# Patient Record
Sex: Male | Born: 1965 | Race: White | Hispanic: No | Marital: Married | Smoking: Never smoker
Health system: Southern US, Community
[De-identification: ages and names within clinical notes are randomized; demographics above are authoritative.]

## PROBLEM LIST (undated history)

## (undated) DIAGNOSIS — K219 Gastro-esophageal reflux disease without esophagitis: Secondary | ICD-10-CM

## (undated) DIAGNOSIS — J349 Unspecified disorder of nose and nasal sinuses: Secondary | ICD-10-CM

## (undated) DIAGNOSIS — J45909 Unspecified asthma, uncomplicated: Secondary | ICD-10-CM

## (undated) DIAGNOSIS — I10 Essential (primary) hypertension: Secondary | ICD-10-CM

## (undated) DIAGNOSIS — F419 Anxiety disorder, unspecified: Secondary | ICD-10-CM

## (undated) DIAGNOSIS — T7840XA Allergy, unspecified, initial encounter: Secondary | ICD-10-CM

## (undated) DIAGNOSIS — C801 Malignant (primary) neoplasm, unspecified: Secondary | ICD-10-CM

## (undated) DIAGNOSIS — G4730 Sleep apnea, unspecified: Secondary | ICD-10-CM

## (undated) DIAGNOSIS — J302 Other seasonal allergic rhinitis: Secondary | ICD-10-CM

## (undated) DIAGNOSIS — J449 Chronic obstructive pulmonary disease, unspecified: Secondary | ICD-10-CM

## (undated) HISTORY — DX: Malignant (primary) neoplasm, unspecified: C80.1

## (undated) HISTORY — DX: Unspecified asthma, uncomplicated: J45.909

## (undated) HISTORY — DX: Gastro-esophageal reflux disease without esophagitis: K21.9

## (undated) HISTORY — DX: Anxiety disorder, unspecified: F41.9

## (undated) HISTORY — DX: Unspecified disorder of nose and nasal sinuses: J34.9

## (undated) HISTORY — DX: Sleep apnea, unspecified: G47.30

## (undated) HISTORY — DX: Chronic obstructive pulmonary disease, unspecified: J44.9

## (undated) HISTORY — DX: Allergy, unspecified, initial encounter: T78.40XA

## (undated) HISTORY — DX: Essential (primary) hypertension: I10

## (undated) HISTORY — DX: Other seasonal allergic rhinitis: J30.2

---

## 1996-07-18 HISTORY — PX: OTHER SURGICAL HISTORY: SHX169

## 1999-09-09 ENCOUNTER — Emergency Department (HOSPITAL_COMMUNITY): Admission: EM | Admit: 1999-09-09 | Discharge: 1999-09-09 | Payer: Self-pay | Admitting: Emergency Medicine

## 1999-09-09 ENCOUNTER — Encounter: Payer: Self-pay | Admitting: Emergency Medicine

## 2001-01-02 ENCOUNTER — Emergency Department (HOSPITAL_COMMUNITY): Admission: EM | Admit: 2001-01-02 | Discharge: 2001-01-02 | Payer: Self-pay | Admitting: Emergency Medicine

## 2010-05-24 ENCOUNTER — Emergency Department (HOSPITAL_BASED_OUTPATIENT_CLINIC_OR_DEPARTMENT_OTHER): Admission: EM | Admit: 2010-05-24 | Discharge: 2010-05-24 | Payer: Self-pay | Admitting: Emergency Medicine

## 2010-05-24 ENCOUNTER — Ambulatory Visit: Payer: Self-pay | Admitting: Diagnostic Radiology

## 2010-06-17 HISTORY — PX: SHOULDER SURGERY: SHX246

## 2011-03-24 ENCOUNTER — Institutional Professional Consult (permissible substitution): Payer: Self-pay | Admitting: Pulmonary Disease

## 2011-06-30 ENCOUNTER — Telehealth: Payer: Self-pay | Admitting: Critical Care Medicine

## 2011-06-30 ENCOUNTER — Ambulatory Visit (INDEPENDENT_AMBULATORY_CARE_PROVIDER_SITE_OTHER): Payer: 59 | Admitting: Critical Care Medicine

## 2011-06-30 ENCOUNTER — Other Ambulatory Visit: Payer: Self-pay | Admitting: Critical Care Medicine

## 2011-06-30 ENCOUNTER — Encounter: Payer: Self-pay | Admitting: Critical Care Medicine

## 2011-06-30 VITALS — BP 130/86 | HR 74 | Temp 97.9°F | Ht 71.0 in | Wt 237.0 lb

## 2011-06-30 DIAGNOSIS — J302 Other seasonal allergic rhinitis: Secondary | ICD-10-CM

## 2011-06-30 DIAGNOSIS — R05 Cough: Secondary | ICD-10-CM

## 2011-06-30 DIAGNOSIS — I10 Essential (primary) hypertension: Secondary | ICD-10-CM

## 2011-06-30 DIAGNOSIS — J45909 Unspecified asthma, uncomplicated: Secondary | ICD-10-CM

## 2011-06-30 DIAGNOSIS — J4540 Moderate persistent asthma, uncomplicated: Secondary | ICD-10-CM | POA: Insufficient documentation

## 2011-06-30 DIAGNOSIS — J309 Allergic rhinitis, unspecified: Secondary | ICD-10-CM

## 2011-06-30 DIAGNOSIS — R059 Cough, unspecified: Secondary | ICD-10-CM

## 2011-06-30 DIAGNOSIS — E119 Type 2 diabetes mellitus without complications: Secondary | ICD-10-CM

## 2011-06-30 MED ORDER — AMLODIPINE BESYLATE 10 MG PO TABS: 10.0000 mg | ORAL_TABLET | Freq: Every day | ORAL | Status: DC

## 2011-06-30 MED ORDER — LOSARTAN POTASSIUM 100 MG PO TABS: 100.0000 mg | ORAL_TABLET | Freq: Every day | ORAL | Status: DC

## 2011-06-30 MED ORDER — AMLODIPINE BESYLATE-VALSARTAN 10-160 MG PO TABS: 1.0000 | ORAL_TABLET | Freq: Every day | ORAL | Status: DC

## 2011-06-30 MED ORDER — MOMETASONE FUROATE 220 MCG/INH IN AEPB: 2.0000 | INHALATION_SPRAY | Freq: Every day | RESPIRATORY_TRACT | Status: DC

## 2011-06-30 MED ORDER — PREDNISONE 10 MG PO TABS: ORAL_TABLET | ORAL | Status: DC

## 2011-06-30 NOTE — Progress Notes (Addendum)
Subjective:    Patient ID: Mike Hayes, male    DOB: [birth year], 45 y.o.   MRN: [id number]  HPI Comments: Chronic cough,  OVs 3-5x over past year.  Prior dx of chronic bronchitis. Rx zpak , minimal help. Cough is chronic for 1 year,  Worse for 3-4 months , tickle in throat and wheezing. Last 4-5 days somewhat better  NEVER smoker  Cough This is a chronic problem. The current episode started more than 1 year ago. The problem has been gradually improving (slowly better over past few days ). The problem occurs hourly. The cough is non-productive (Never has been productive). Associated symptoms include wheezing. Pertinent negatives include no chest pain, chills, ear congestion, ear pain, fever, headaches, heartburn, hemoptysis, myalgias, nasal congestion, postnasal drip, rash, rhinorrhea, sore throat, shortness of breath, sweats or weight loss. Associated symptoms comments: Wheezes mainly at night No soreness in throat or hoarseness, Does have a tickle. The symptoms are aggravated by cold air, lying down, pollens and fumes. He has tried a beta-agonist inhaler (PPI, antihistamines, ventolin inhaler) for the symptoms. The treatment provided no relief. His past medical history is significant for asthma, bronchitis and environmental allergies. There is no history of bronchiectasis, COPD, emphysema or pneumonia. outgrew asthma by age 75, teenager tested for allergies, no real reactions      Review of Systems  Constitutional: Negative for fever, chills, weight loss, diaphoresis, activity change, appetite change, fatigue and unexpected weight change.  HENT: Positive for sneezing. Negative for hearing loss, ear pain, nosebleeds, congestion, sore throat, facial swelling, rhinorrhea, mouth sores, trouble swallowing, neck pain, neck stiffness, dental problem, voice change, postnasal drip, sinus pressure, tinnitus and ear discharge.   Eyes: Negative for photophobia, discharge, itching and visual disturbance.   Respiratory: Positive for cough and wheezing. Negative for apnea, hemoptysis, choking, chest tightness, shortness of breath and stridor.   Cardiovascular: Negative for chest pain, palpitations and leg swelling.  Gastrointestinal: Negative for heartburn, nausea, vomiting, abdominal pain, constipation, blood in stool and abdominal distention.  Genitourinary: Negative for dysuria, urgency, frequency, hematuria, flank pain, decreased urine volume and difficulty urinating.  Musculoskeletal: Negative for myalgias, back pain, joint swelling, arthralgias and gait problem.  Skin: Negative for color change, pallor and rash.  Neurological: Negative for dizziness, tremors, seizures, syncope, speech difficulty, weakness, light-headedness, numbness and headaches.  Hematological: Positive for environmental allergies. Negative for adenopathy. Does not bruise/bleed easily.  Psychiatric/Behavioral: Negative for confusion, sleep disturbance and agitation. The patient is not nervous/anxious.        Objective:   Physical Exam  Filed Vitals:   06/30/11 1118  BP: 130/86  Pulse: 74  Temp: 97.9 F (36.6 C)  TempSrc: Oral  Height: 5\' 11"  (1.803 m)  Weight: 237 lb (107.502 kg)  SpO2: 97%    Gen: Pleasant, well-nourished, in no distress,  normal affect  ENT: No lesions,  mouth clear,  oropharynx clear, ++  postnasal drip  Neck: No JVD, no TMG, no carotid bruits  Lungs: No use of accessory muscles, no dullness to percussion, exp wheeze  Cardiovascular: RRR, heart sounds normal, no murmur or gallops, no peripheral edema  Abdomen: soft and NT, no HSM,  BS normal  Musculoskeletal: No deformities, no cyanosis or clubbing  Neuro: alert, non focal  Skin: Warm, no lesions or rashes  Note CXR 11/12: no active disease      Assessment & Plan:   Extrinsic asthma, unspecified Moderate persistent asthma d/t likely environmental factors 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 Calcium-Phosphorus-Vitamin D (GELATIN/CALCIUM/VITAMIN D PO) Take 1,000 Units by mouth 2 (two) times daily.        Marland Kitchen glimepiride (AMARYL) 4 MG tablet Take 4 mg by mouth 2 (two) times daily.        . hydrochlorothiazide (HYDRODIURIL) 25 MG tablet Take 25 mg by mouth daily.        . metFORMIN (GLUCOPHAGE) 1000 MG tablet Take 1,000 mg by mouth 2 (two) times daily with a meal.        . DISCONTD: amLODipine-benazepril (LOTREL) 10-20 MG per capsule Take 1 capsule by mouth daily.        . mometasone (ASMANEX 120 METERED DOSES) 220 MCG/INH inhaler Inhale 2 puffs into the lungs daily.  1 Inhaler  12  . predniSONE (DELTASONE) 10 MG tablet Take 4 for two days three for two days two for two days one for two days  20 tablet  0  . DISCONTD: amLODipine-valsartan (EXFORGE) 10-160 MG per tablet Take 1 tablet by mouth daily.  30 tablet  6

## 2011-06-30 NOTE — Patient Instructions (Signed)
Start Asmanex two puff daily Prednisone 10mg  Take 4 for two days three for two days two for two days one for two days Stop Lotrel Start amlodipine/valsartan one daily Follow a reflux diet if possible Allergy labs today Return 2 months

## 2011-06-30 NOTE — Telephone Encounter (Signed)
Called and spoke with pt.  Informed him of PW's response and rx sent to pharmacy.  

## 2011-06-30 NOTE — Telephone Encounter (Signed)
rx losartan 100mg  daily and amlodipine 10mg  daily each

## 2011-06-30 NOTE — Progress Notes (Deleted)
  Subjective:    Patient ID: Mike Hayes, male    DOB: 1965-08-22, 45 y.o.   MRN: [id number]  HPI    Review of Systems     Objective:   Physical Exam        Assessment & Plan:

## 2011-06-30 NOTE — Telephone Encounter (Signed)
Dr Delford Field please advise about the 2 meds, strength and dose  pt will need to replace the exforge given at today's ov pt is unable to afford this medication.

## 2011-07-01 LAB — ALLERGY FULL PROFILE
Aspergillus fumigatus, IgG: 0.5 kU/L — ABNORMAL HIGH (ref <0.35)
Bahia Grass: 3.06 kU/L — ABNORMAL HIGH (ref <0.35)
Bermuda Grass: 1.42 kU/L — ABNORMAL HIGH (ref <0.35)
Candida Albicans: 0.21 kU/L (ref <0.35)
Cat Dander: 0.13 kU/L (ref <0.35)
Curvularia lunata: 0.5 kU/L — ABNORMAL HIGH (ref <0.35)
D. farinae: 7.87 kU/L — ABNORMAL HIGH (ref <0.35)
Elm IgE: 0.14 kU/L (ref <0.35)
Fescue: 6.72 kU/L — ABNORMAL HIGH (ref <0.35)
G009 Red Top: 7.15 kU/L — ABNORMAL HIGH (ref <0.35)
Oak: 0.28 kU/L (ref <0.35)
Sycamore Tree: 0.61 kU/L — ABNORMAL HIGH (ref <0.35)
Timothy Grass: 5.78 kU/L — ABNORMAL HIGH (ref <0.35)

## 2011-07-01 NOTE — Assessment & Plan Note (Signed)
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

## 2011-09-05 ENCOUNTER — Other Ambulatory Visit: Payer: Self-pay | Admitting: Family Medicine

## 2012-02-12 ENCOUNTER — Other Ambulatory Visit: Payer: Self-pay | Admitting: Critical Care Medicine

## 2012-03-08 ENCOUNTER — Ambulatory Visit: Payer: 59 | Admitting: Critical Care Medicine

## 2012-04-12 ENCOUNTER — Ambulatory Visit (INDEPENDENT_AMBULATORY_CARE_PROVIDER_SITE_OTHER): Payer: 59 | Admitting: Critical Care Medicine

## 2012-04-12 ENCOUNTER — Encounter: Payer: Self-pay | Admitting: Critical Care Medicine

## 2012-04-12 VITALS — BP 114/84 | HR 78 | Temp 98.1°F | Ht 71.0 in | Wt 233.0 lb

## 2012-04-12 DIAGNOSIS — J45909 Unspecified asthma, uncomplicated: Secondary | ICD-10-CM

## 2012-04-12 MED ORDER — PREDNISONE 10 MG PO TABS: ORAL_TABLET | ORAL | Status: DC

## 2012-04-12 MED ORDER — MOMETASONE FUROATE 220 MCG/INH IN AEPB: 2.0000 | INHALATION_SPRAY | Freq: Every day | RESPIRATORY_TRACT | Status: DC

## 2012-04-12 MED ORDER — AZITHROMYCIN 250 MG PO TABS: 250.0000 mg | ORAL_TABLET | Freq: Every day | ORAL | Status: DC

## 2012-04-12 NOTE — Progress Notes (Addendum)
Subjective:    Patient ID: Mike Hayes, male    DOB: Nov 21, 1965, 46 y.o.   MRN: [id number]  HPI 04/12/2012 Pt seen once 06/2011 for chronic cough Short course prednisone Start asmanex two puff daily chk allergy profile>>>IgE 277. Multiple postive allergens Change lotrel to losartan. All Rx helped the cough.  Pt well until 4 days ago with more cough and URI symptoms.  Now non productive cough.  No change in dyspnea. Pt noting some wheeze.   No pndrip.  No sinus pressure.  No chest pain, does note tightness with cough.  No fever/chills/sweats. No ABX or pred since last OV 12.2012  PUL ASTHMA HISTORY 04/12/2012  Symptoms Daily  Nighttime awakenings 0-2/month  Interference with activity Minor limitations  SABA use 0-2 days/wk  Exacerbations requiring oral steroids 0-1 / year     Past Medical History  Diagnosis Date  . High blood pressure   . Childhood asthma   . Diabetes mellitus   . Seasonal allergies   . Sinus trouble            No Known Allergies   Outpatient Prescriptions Prior to Visit  Medication Sig Dispense Refill  . amLODipine (NORVASC) 10 MG tablet TAKE 1 TABLET EVERY DAY  30 tablet  0  . Calcium-Phosphorus-Vitamin D (GELATIN/CALCIUM/VITAMIN D PO) Take 2,000 Units by mouth daily.       Marland Kitchen glimepiride (AMARYL) 4 MG tablet Take 4 mg by mouth 2 (two) times daily.        . hydrochlorothiazide (HYDRODIURIL) 25 MG tablet Take 25 mg by mouth daily.        Marland Kitchen losartan (COZAAR) 100 MG tablet TAKE 1 TABLET EVERY DAY  30 tablet  0  . metFORMIN (GLUCOPHAGE) 1000 MG tablet Take 1,000 mg by mouth 2 (two) times daily with a meal.        . mometasone (ASMANEX 120 METERED DOSES) 220 MCG/INH inhaler Inhale 2 puffs into the lungs daily.  1 Inhaler  12  . cetirizine (ZYRTEC) 10 MG tablet TAKE 1 TABLET EVERY DAY  30 tablet  11  . predniSONE (DELTASONE) 10 MG tablet Take 4 for two days three for two days two for two days one for two days  20 tablet  0      Review of Systems    Constitutional: Negative for diaphoresis, activity change, appetite change, fatigue and unexpected weight change.  HENT: Positive for sneezing. Negative for hearing loss, nosebleeds, congestion, facial swelling, mouth sores, trouble swallowing, neck pain, neck stiffness, dental problem, voice change, sinus pressure, tinnitus and ear discharge.   Eyes: Negative for photophobia, discharge, itching and visual disturbance.  Respiratory: Positive for cough, chest tightness, shortness of breath and wheezing. Negative for apnea, choking and stridor.   Cardiovascular: Negative for palpitations and leg swelling.  Gastrointestinal: Negative for nausea, vomiting, abdominal pain, constipation, blood in stool and abdominal distention.  Genitourinary: Negative for dysuria, urgency, frequency, hematuria, flank pain, decreased urine volume and difficulty urinating.  Musculoskeletal: Negative for back pain, joint swelling, arthralgias and gait problem.  Skin: Negative for color change and pallor.  Neurological: Negative for dizziness, tremors, seizures, syncope, speech difficulty, weakness, light-headedness and numbness.  Hematological: Negative for adenopathy. Does not bruise/bleed easily.  Psychiatric/Behavioral: Negative for confusion, disturbed wake/sleep cycle and agitation. The patient is not nervous/anxious.        Objective:   Physical Exam   Filed Vitals:   04/12/12 1149  BP: 114/84  Pulse: 78  Temp: 98.1 F (  36.7 C)  TempSrc: Oral  Height: 5\' 11"  (1.803 m)  Weight: 233 lb (105.688 kg)  SpO2: 98%    Gen: Pleasant, well-nourished, in no distress,  normal affect  ENT: No lesions,  mouth clear,  oropharynx clear, ++  postnasal drip  Neck: No JVD, no TMG, no carotid bruits  Lungs: No use of accessory muscles, no dullness to percussion, exp wheeze  Cardiovascular: RRR, heart sounds normal, no murmur or gallops, no peripheral edema  Abdomen: soft and NT, no HSM,  BS  normal  Musculoskeletal: No deformities, no cyanosis or clubbing  Neuro: alert, non focal  Skin: Warm, no lesions or rashes      Assessment & Plan:   Extrinsic asthma, unspecified Moderate persistent asthma with significant allergic  atopic factors, IgE level greater than 250 Now with acute flare Plan Pulse prednisone Azithromycin for 5 days More regular use of Asmanex at 2 puffs daily Return 4 months   Updated Medication List Outpatient Encounter Prescriptions as of 04/12/2012  Medication Sig Dispense Refill  . amLODipine (NORVASC) 10 MG tablet TAKE 1 TABLET EVERY DAY  30 tablet  0  . Calcium-Phosphorus-Vitamin D (GELATIN/CALCIUM/VITAMIN D PO) Take 2,000 Units by mouth daily.       Marland Kitchen glimepiride (AMARYL) 4 MG tablet Take 4 mg by mouth 2 (two) times daily.        . hydrochlorothiazide (HYDRODIURIL) 25 MG tablet Take 25 mg by mouth daily.        . Liraglutide (VICTOZA) 18 MG/3ML SOLN Inject 1.8 mg into the skin daily.      Marland Kitchen losartan (COZAAR) 100 MG tablet TAKE 1 TABLET EVERY DAY  30 tablet  0  . metFORMIN (GLUCOPHAGE) 1000 MG tablet Take 1,000 mg by mouth 2 (two) times daily with a meal.        . mometasone (ASMANEX) 220 MCG/INH inhaler Inhale 2 puffs into the lungs daily.  1 Inhaler  11  . DISCONTD: mometasone (ASMANEX 120 METERED DOSES) 220 MCG/INH inhaler Inhale 2 puffs into the lungs daily.  1 Inhaler  12  . DISCONTD: mometasone (ASMANEX) 220 MCG/INH inhaler Inhale 2 puffs into the lungs daily as needed.      Marland Kitchen azithromycin (ZITHROMAX) 250 MG tablet Take 1 tablet (250 mg total) by mouth daily. Take two once then one daily until gone  6 each  0  . predniSONE (DELTASONE) 10 MG tablet Take 4 for two days three for two days two for two days one for two days  20 tablet  0  . DISCONTD: cetirizine (ZYRTEC) 10 MG tablet TAKE 1 TABLET EVERY DAY  30 tablet  11  . DISCONTD: predniSONE (DELTASONE) 10 MG tablet Take 4 for two days three for two days two for two days one for two days  20  tablet  0

## 2012-04-12 NOTE — Assessment & Plan Note (Addendum)
Moderate persistent asthma with significant allergic  atopic factors, IgE level greater than 250 Now with acute flare Plan Pulse prednisone Azithromycin for 5 days More regular use of Asmanex at 2 puffs daily Return 4 months

## 2012-04-12 NOTE — Patient Instructions (Addendum)
Resume asmanex two puff daily (sent to [facility] [place]) Sent downstairs pharmacy: Take prednisone 10mg  Take 4 for two days three for two days two for two days one for two days Take azithromycin 250mg  Take two once then one daily until gone Return 4 months for recheck

## 2012-08-16 ENCOUNTER — Ambulatory Visit: Payer: 59 | Admitting: Critical Care Medicine

## 2012-08-23 ENCOUNTER — Encounter: Payer: Self-pay | Admitting: Critical Care Medicine

## 2012-08-23 ENCOUNTER — Ambulatory Visit (INDEPENDENT_AMBULATORY_CARE_PROVIDER_SITE_OTHER): Payer: 59 | Admitting: Critical Care Medicine

## 2012-08-23 VITALS — BP 122/84 | HR 104 | Temp 98.5°F | Ht 71.0 in | Wt 236.0 lb

## 2012-08-23 DIAGNOSIS — J45909 Unspecified asthma, uncomplicated: Secondary | ICD-10-CM

## 2012-08-23 DIAGNOSIS — J019 Acute sinusitis, unspecified: Secondary | ICD-10-CM

## 2012-08-23 MED ORDER — FLUTICASONE PROPIONATE 50 MCG/ACT NA SUSP: 2.0000 | Freq: Every day | NASAL | Status: DC

## 2012-08-23 MED ORDER — AZITHROMYCIN 250 MG PO TABS: ORAL_TABLET | ORAL | Status: DC

## 2012-08-23 MED ORDER — PREDNISONE 10 MG PO TABS: ORAL_TABLET | ORAL | Status: DC

## 2012-08-23 MED ORDER — ALBUTEROL SULFATE HFA 108 (90 BASE) MCG/ACT IN AERS: 2.0000 | INHALATION_SPRAY | Freq: Four times a day (QID) | RESPIRATORY_TRACT | Status: DC | PRN

## 2012-08-23 NOTE — Patient Instructions (Addendum)
Take azithromycin 250mg  Take two once then one daily until gone Take prednisone 10mg  Take 4 for two days three for two days two for two days one for two days Above meds sent down stairs along with the flonase USe ventolin as needed Stay on asmanex Start flonase two puff each nostril for one cannister then stop or use as needed for sinuses Return 6 months or sooner as needed

## 2012-08-23 NOTE — Assessment & Plan Note (Signed)
Moderate persistent asthma with acute sinusitis and now exacerbation of asthma Plan Azithromycin for 5 days Eight day  taper of prednisone Initiation of Flonase daily Continue Asmanex

## 2012-08-23 NOTE — Progress Notes (Addendum)
Subjective:    Patient ID: Mike Hayes, male    DOB: [birth year], 47 y.o.   MRN: [id number]  HPI   PUL ASTHMA HISTORY 08/23/2012 04/12/2012  Symptoms Daily Daily  Nighttime awakenings 3-4/month 0-2/month  Interference with activity No limitations Minor limitations  SABA use 0-2 days/wk 0-2 days/wk  Exacerbations requiring oral steroids 0-1 / year 0-1 / year   08/23/2012 Pt noted cough 2 days ago.  Notes more wheezing.  Notes a headache. More cough is noted.   The patient is using his rescue inhaler on a more frequent basis. There is increased shortness of breath and increased nasal drainage. There is no fever chills or sweats.   Past Medical History  Diagnosis Date  . High blood pressure   . Childhood asthma   . Diabetes mellitus   . Seasonal allergies   . Sinus trouble      Family History  Problem Relation Age of Onset  . Allergies Mother   . Asthma Mother   . Asthma Father   . Cancer Paternal Grandmother   . Cancer Maternal Grandmother     was a heavy smoker  . Cancer Maternal Grandfather     was a heavy smoker  . COPD Mother   . Diabetes type II Mother   . Diabetes type II Father      History   Social History  . Marital Status: Married    Spouse Name: N/A    Number of Children: 3  . Years of Education: N/A   Occupational History  . deputy Psychologist, occupational    Other Topics Concern  . Not on file   Social History Narrative  . No narrative on file     No Known Allergies   Outpatient Prescriptions Prior to Visit  Medication Sig Dispense Refill  . amLODipine (NORVASC) 10 MG tablet TAKE 1 TABLET EVERY DAY  30 tablet  0  . Calcium-Phosphorus-Vitamin D (GELATIN/CALCIUM/VITAMIN D PO) Take 2,000 Units by mouth daily.       Marland Kitchen glimepiride (AMARYL) 4 MG tablet Take 4 mg by mouth 2 (two) times daily.        . hydrochlorothiazide (HYDRODIURIL) 25 MG tablet Take 25 mg by mouth daily.        . Liraglutide (VICTOZA) 18 MG/3ML SOLN Inject 1.8 mg into the  skin daily.      Marland Kitchen losartan (COZAAR) 100 MG tablet TAKE 1 TABLET EVERY DAY  30 tablet  0  . metFORMIN (GLUCOPHAGE) 1000 MG tablet Take 1,000 mg by mouth 2 (two) times daily with a meal.        . mometasone (ASMANEX) 220 MCG/INH inhaler Inhale 2 puffs into the lungs daily.  1 Inhaler  11  . [DISCONTINUED] azithromycin (ZITHROMAX) 250 MG tablet Take 1 tablet (250 mg total) by mouth daily. Take two once then one daily until gone  6 each  0  . [DISCONTINUED] predniSONE (DELTASONE) 10 MG tablet Take 4 for two days three for two days two for two days one for two days  20 tablet  0  Last reviewed on 08/23/2012 11:32 AM by Storm Frisk, MD    Review of Systems  Constitutional: Negative for diaphoresis, activity change, appetite change, fatigue and unexpected weight change.  HENT: Positive for sneezing. Negative for hearing loss, nosebleeds, congestion, facial swelling, mouth sores, trouble swallowing, neck pain, neck stiffness, dental problem, voice change, sinus pressure, tinnitus and ear discharge.   Eyes: Negative  for photophobia, discharge, itching and visual disturbance.  Respiratory: Positive for cough, chest tightness, shortness of breath and wheezing. Negative for apnea, choking and stridor.   Cardiovascular: Negative for palpitations and leg swelling.  Gastrointestinal: Negative for nausea, vomiting, abdominal pain, constipation, blood in stool and abdominal distention.  Genitourinary: Negative for dysuria, urgency, frequency, hematuria, flank pain, decreased urine volume and difficulty urinating.  Musculoskeletal: Negative for back pain, joint swelling, arthralgias and gait problem.  Skin: Negative for color change and pallor.  Neurological: Negative for dizziness, tremors, seizures, syncope, speech difficulty, weakness, light-headedness and numbness.  Hematological: Negative for adenopathy. Does not bruise/bleed easily.  Psychiatric/Behavioral: Negative for confusion, sleep disturbance and  agitation. The patient is not nervous/anxious.        Objective:   Physical Exam   Filed Vitals:   08/23/12 1123  BP: 122/84  Pulse: 104  Temp: 98.5 F (36.9 C)  TempSrc: Oral  Height: 5\' 11"  (1.803 m)  Weight: 236 lb (107.049 kg)  SpO2: 94%    Gen: Pleasant, well-nourished, in no distress,  normal affect  ENT: No lesions,  mouth clear,  oropharynx clear, ++  postnasal drip  Neck: No JVD, no TMG, no carotid bruits  Lungs: No use of accessory muscles, no dullness to percussion, exp wheeze  Cardiovascular: RRR, heart sounds normal, no murmur or gallops, no peripheral edema  Abdomen: soft and NT, no HSM,  BS normal  Musculoskeletal: No deformities, no cyanosis or clubbing  Neuro: alert, non focal  Skin: Warm, no lesions or rashes      Assessment & Plan:   Extrinsic asthma, unspecified Moderate persistent asthma with acute sinusitis and now exacerbation of asthma Plan Azithromycin for 5 days Eight day  taper of prednisone Initiation of Flonase daily Continue Asmanex   Updated Medication List Outpatient Encounter Prescriptions as of 08/23/2012  Medication Sig Dispense Refill  . amLODipine (NORVASC) 10 MG tablet TAKE 1 TABLET EVERY DAY  30 tablet  0  . Calcium-Phosphorus-Vitamin D (GELATIN/CALCIUM/VITAMIN D PO) Take 2,000 Units by mouth daily.       Marland Kitchen glimepiride (AMARYL) 4 MG tablet Take 4 mg by mouth 2 (two) times daily.        . hydrochlorothiazide (HYDRODIURIL) 25 MG tablet Take 25 mg by mouth daily.        . Liraglutide (VICTOZA) 18 MG/3ML SOLN Inject 1.8 mg into the skin daily.      Marland Kitchen losartan (COZAAR) 100 MG tablet TAKE 1 TABLET EVERY DAY  30 tablet  0  . metFORMIN (GLUCOPHAGE) 1000 MG tablet Take 1,000 mg by mouth 2 (two) times daily with a meal.        . mometasone (ASMANEX) 220 MCG/INH inhaler Inhale 2 puffs into the lungs daily.  1 Inhaler  11  . albuterol (PROVENTIL HFA;VENTOLIN HFA) 108 (90 BASE) MCG/ACT inhaler Inhale 2 puffs into the lungs every 6  (six) hours as needed for wheezing.  6.7 g  0  . azithromycin (ZITHROMAX) 250 MG tablet Take two once then one daily until gone  6 each  0  . fluticasone (FLONASE) 50 MCG/ACT nasal spray Place 2 sprays into the nose daily.  16 g  2  . predniSONE (DELTASONE) 10 MG tablet Take 4 for two days three for two days two for two days one for two days  20 tablet  0  . [DISCONTINUED] azithromycin (ZITHROMAX) 250 MG tablet Take 1 tablet (250 mg total) by mouth daily. Take two once then one daily until  gone  6 each  0  . [DISCONTINUED] predniSONE (DELTASONE) 10 MG tablet Take 4 for two days three for two days two for two days one for two days  20 tablet  0

## 2012-09-04 ENCOUNTER — Other Ambulatory Visit: Payer: Self-pay | Admitting: Critical Care Medicine

## 2012-09-05 MED ORDER — AZITHROMYCIN 250 MG PO TABS: ORAL_TABLET | ORAL | Status: DC

## 2012-09-05 NOTE — Telephone Encounter (Signed)
Pt was seen on 08/23/12 by PW:  Patient Instructions    Take azithromycin 250mg  Take two once then one daily until gone  Take prednisone 10mg  Take 4 for two days three for two days two for two days one for two days  Above meds sent down stairs along with the flonase  USe ventolin as needed  Stay on asmanex  Start flonase two puff each nostril for one cannister then stop or use as needed for sinuses  Return 6 months or sooner as needed    -----  Pt states he has finished zpak and pred taper but still has "a little" chest congestion, nonprod cough, and "a little" wheezing.  Denies increased SOB, chest tightness, chest pain, PND, sinus pressure, or f/c/s.  [facility] [place].  Dr. Delford Field, pls advise if ok to give or any further recs you may have.  Thank you.

## 2012-09-05 NOTE — Telephone Encounter (Signed)
Z pak rx was sent to [facility] by PW.    I have called and cancelled this rx with Phoebe Sharps as it was sent to wrong pharm and [facility] [place].    I called, spoke with pt and advised we [facility] but he is to call for OV if symptoms do not improve or worsen.  He verbalized understanding of this and voiced no further questions or concerns at this time.

## 2012-09-05 NOTE — Addendum Note (Signed)
Addended by: Gweneth Dimitri D on: 09/05/2012 05:07 PM   Modules accepted: Orders

## 2012-09-19 ENCOUNTER — Emergency Department (HOSPITAL_BASED_OUTPATIENT_CLINIC_OR_DEPARTMENT_OTHER): Payer: 59

## 2012-09-19 ENCOUNTER — Emergency Department (HOSPITAL_BASED_OUTPATIENT_CLINIC_OR_DEPARTMENT_OTHER)
Admission: EM | Admit: 2012-09-19 | Discharge: 2012-09-19 | Disposition: A | Discharge status: Home / Self Care | Payer: 59 | Attending: Emergency Medicine | Admitting: Emergency Medicine

## 2012-09-19 ENCOUNTER — Encounter (HOSPITAL_BASED_OUTPATIENT_CLINIC_OR_DEPARTMENT_OTHER): Payer: Self-pay | Admitting: Family Medicine

## 2012-09-19 DIAGNOSIS — E119 Type 2 diabetes mellitus without complications: Secondary | ICD-10-CM | POA: Insufficient documentation

## 2012-09-19 DIAGNOSIS — Z8679 Personal history of other diseases of the circulatory system: Secondary | ICD-10-CM | POA: Insufficient documentation

## 2012-09-19 DIAGNOSIS — F17200 Nicotine dependence, unspecified, uncomplicated: Secondary | ICD-10-CM | POA: Insufficient documentation

## 2012-09-19 DIAGNOSIS — Z8709 Personal history of other diseases of the respiratory system: Secondary | ICD-10-CM | POA: Insufficient documentation

## 2012-09-19 DIAGNOSIS — J45909 Unspecified asthma, uncomplicated: Secondary | ICD-10-CM | POA: Insufficient documentation

## 2012-09-19 DIAGNOSIS — R209 Unspecified disturbances of skin sensation: Secondary | ICD-10-CM | POA: Insufficient documentation

## 2012-09-19 DIAGNOSIS — Z79899 Other long term (current) drug therapy: Secondary | ICD-10-CM | POA: Insufficient documentation

## 2012-09-19 DIAGNOSIS — IMO0002 Reserved for concepts with insufficient information to code with codable children: Secondary | ICD-10-CM | POA: Insufficient documentation

## 2012-09-19 DIAGNOSIS — Y9289 Other specified places as the place of occurrence of the external cause: Secondary | ICD-10-CM | POA: Insufficient documentation

## 2012-09-19 DIAGNOSIS — S60229A Contusion of unspecified hand, initial encounter: Secondary | ICD-10-CM | POA: Insufficient documentation

## 2012-09-19 DIAGNOSIS — W010XXA Fall on same level from slipping, tripping and stumbling without subsequent striking against object, initial encounter: Secondary | ICD-10-CM | POA: Insufficient documentation

## 2012-09-19 DIAGNOSIS — Y9329 Activity, other involving ice and snow: Secondary | ICD-10-CM | POA: Insufficient documentation

## 2012-09-19 NOTE — ED Notes (Signed)
Pt sts he fell on ice landing on right hand at 8am this morning. Cms intact, no obvious deformity.

## 2012-09-19 NOTE — ED Provider Notes (Signed)
History     CSN: [id number]  Arrival date & time 09/19/12  7829   First MD Initiated Contact with Patient 09/19/12 1012      Chief Complaint  Patient presents with  . Hand Injury    (Consider location/radiation/quality/duration/timing/severity/associated sxs/prior treatment) Patient is a 47 y.o. male presenting with hand injury. The history is provided by the patient.  Hand Injury Associated symptoms: no back pain, no fever and no neck pain    patient slipped on the ice this morning. Landing on his right hand pain over the fourth and fifth metacarpals. He is right-hand dominant no other injuries. States the pain at worst was 8/10 currently about a 6/10. Some numbness to the little finger. No brains or wounds. No wrist tenderness no elbow tenderness.  Past Medical History  Diagnosis Date  . High blood pressure   . Childhood asthma   . Diabetes mellitus   . Seasonal allergies   . Sinus trouble     Past Surgical History  Procedure Laterality Date  . Shoulder surgery  dec. 2011    right shoulder  . [place] teeth  1998    Family History  Problem Relation Age of Onset  . Allergies Mother   . Asthma Mother   . Asthma Father   . Cancer Paternal Grandmother   . Cancer Maternal Grandmother     was a heavy smoker  . Cancer Maternal Grandfather     was a heavy smoker  . COPD Mother   . Diabetes type II Mother   . Diabetes type II Father     History  Substance Use Topics  . Smoking status: Current Some Day Smoker    Types: Cigars  . Smokeless tobacco: Never Used     Comment: 2 cigars a year  . Alcohol Use: Yes     Comment: socially      Review of Systems  Constitutional: Negative for fever.  HENT: Negative for neck pain.   Eyes: Negative for visual disturbance.  Respiratory: Negative for shortness of breath.   Cardiovascular: Negative for chest pain.  Gastrointestinal: Negative for abdominal pain.  Musculoskeletal: Negative for back pain.  Skin: Negative for  rash and wound.  Neurological: Negative for syncope and headaches.  Hematological: Does not bruise/bleed easily.    Allergies  Review of patient's allergies indicates no known allergies.  Home Medications   Current Outpatient Rx  Name  Route  Sig  Dispense  Refill  . albuterol (PROVENTIL HFA;VENTOLIN HFA) 108 (90 BASE) MCG/ACT inhaler   Inhalation   Inhale 2 puffs into the lungs every 6 (six) hours as needed for wheezing.   6.7 g   0   . amLODipine (NORVASC) 10 MG tablet      TAKE 1 TABLET EVERY DAY   30 tablet   0   . azithromycin (ZITHROMAX) 250 MG tablet      TAKE 2 TABLETS BY MOUTH ON DAY 1 THEN TAKE 1 TABLET DAILY FOR THE NEXT 4 DAYS   6 tablet   0   . Calcium-Phosphorus-Vitamin D (GELATIN/CALCIUM/VITAMIN D PO)   Oral   Take 2,000 Units by mouth daily.          . fluticasone (FLONASE) 50 MCG/ACT nasal spray   Nasal   Place 2 sprays into the nose daily.   16 g   2   . glimepiride (AMARYL) 4 MG tablet   Oral   Take 4 mg by mouth 2 (two) times daily.           Marland Kitchen  hydrochlorothiazide (HYDRODIURIL) 25 MG tablet   Oral   Take 25 mg by mouth daily.           . Liraglutide (VICTOZA) 18 MG/3ML SOLN   Subcutaneous   Inject 1.8 mg into the skin daily.         Marland Kitchen losartan (COZAAR) 100 MG tablet      TAKE 1 TABLET EVERY DAY   30 tablet   0   . metFORMIN (GLUCOPHAGE) 1000 MG tablet   Oral   Take 1,000 mg by mouth 2 (two) times daily with a meal.           . mometasone (ASMANEX) 220 MCG/INH inhaler   Inhalation   Inhale 2 puffs into the lungs daily.   1 Inhaler   11   . predniSONE (DELTASONE) 10 MG tablet      Take 4 for two days three for two days two for two days one for two days   20 tablet   0     BP 157/89  Pulse 77  Temp(Src) 97.7 F (36.5 C) (Oral)  Resp 16  SpO2 100%  Physical Exam  Nursing note and vitals reviewed. Constitutional: He is oriented to person, place, and time. He appears well-developed and well-nourished.  HENT:   Head: Normocephalic and atraumatic.  Eyes: Conjunctivae and EOM are normal. Pupils are equal, round, and reactive to light.  Neck: Normal range of motion.  Cardiovascular: Normal rate, regular rhythm and normal heart sounds.   No murmur heard. Pulmonary/Chest: Effort normal and breath sounds normal.  Abdominal: Soft. Bowel sounds are normal. There is no tenderness.  Musculoskeletal: Normal range of motion. He exhibits tenderness.  Tenderness to the right hand over the fourth and fifth metacarpals. Cap refill is normal at 1 seconds. Radial pulse is 2+ no snuffbox tenderness no wrist tenderness no elbow tenderness no forearm tenderness. No deformity.  Neurological: He is alert and oriented to person, place, and time. No cranial nerve deficit. He exhibits normal muscle tone. Coordination normal.  Skin: Skin is warm. No rash noted. No erythema.    ED Course  Procedures (including critical care time)  Labs Reviewed - No data to display Dg Hand Complete Right  09/19/2012  *RADIOLOGY REPORT*  Clinical Data: Hand injury  RIGHT HAND - COMPLETE 3+ VIEW  Comparison: None.  Findings: Three views of the right hand submitted.  No acute fracture or subluxation.  Mild degenerative changes interphalangeal joint of the thumb.  IMPRESSION: No acute fracture or subluxation.  Mild degenerative changes.   Original Report Authenticated By: Natasha Mead, M.D.      1. Contusion, hand, right, initial encounter       MDM  X-ray negative for any acute fracture to the right hand. Therefore this is a sprain or contusion. As stated above no concern for navicular or scaphoid fracture or injury. No snuff box tenderness.        Shelda Jakes, MD 09/19/12 [phone]

## 2013-02-04 ENCOUNTER — Telehealth: Payer: Self-pay | Admitting: *Deleted

## 2013-02-04 ENCOUNTER — Telehealth: Payer: Self-pay | Admitting: Critical Care Medicine

## 2013-02-04 MED ORDER — AMOXICILLIN-POT CLAVULANATE 875-125 MG PO TABS: 1.0000 | ORAL_TABLET | Freq: Two times a day (BID) | ORAL | Status: DC

## 2013-02-04 MED ORDER — PREDNISONE 10 MG PO TABS: ORAL_TABLET | ORAL | Status: DC

## 2013-02-04 NOTE — Telephone Encounter (Signed)
Prednisone 10mg  Take 4 for two days three for two days two for two days one for two days augmentin 875 bid x days

## 2013-02-04 NOTE — Telephone Encounter (Signed)
DUPLICATE MESSAGE ... DISREGARD

## 2013-02-04 NOTE — Telephone Encounter (Signed)
Spoke with [employer], who verified by written documentation by PW that pt's augmentin is #14 (1 po BID x7days) and prednisone #20 (4 tabs x2 days, 3 x2 days, 2 x 2days, 1 x 2days and stop) Called spoke with patient, advised of PW's recs as stated below Rx sent to verified pharmacy Nothing further needed at this time; will sign off.

## 2013-02-04 NOTE — Telephone Encounter (Signed)
Per [place] P[facility] conversation with pt on 02/04/13: Pt c/o "dry cough, chest congestion, wheezing, runny nose with clear-to-yellow mucus, x 10 days.  Took a zpak at onset by DM doctor."    nkda - verified.  [facility]

## 2013-03-11 ENCOUNTER — Ambulatory Visit (INDEPENDENT_AMBULATORY_CARE_PROVIDER_SITE_OTHER): Payer: 59 | Admitting: Critical Care Medicine

## 2013-03-11 ENCOUNTER — Encounter: Payer: Self-pay | Admitting: Critical Care Medicine

## 2013-03-11 VITALS — BP 102/62 | HR 88 | Temp 97.8°F | Wt 229.0 lb

## 2013-03-11 DIAGNOSIS — J45909 Unspecified asthma, uncomplicated: Secondary | ICD-10-CM

## 2013-03-11 MED ORDER — MOMETASONE FUROATE 220 MCG/INH IN AEPB: 2.0000 | INHALATION_SPRAY | Freq: Every day | RESPIRATORY_TRACT | Status: DC

## 2013-03-11 NOTE — Assessment & Plan Note (Signed)
Moderate persistent asthma stable at this time on inhaled steroid with no excess use of short acting bronchodilator Plan Maintain Asmanex 2 puff daily Albuterol as needed The patient can be cleared for planned gastric bypass surgery from a pulmonary standpoint Return one year sooner if necessary

## 2013-03-11 NOTE — Progress Notes (Addendum)
Subjective:    Patient ID: Mike Hayes, male    DOB: 04-18-1966, 47 y.o.   MRN: [id number]  HPI   PUL ASTHMA HISTORY 03/11/2013 08/23/2012 04/12/2012  Symptoms 0-2 days/week Daily Daily  Nighttime awakenings 0-2/month 3-4/month 0-2/month  Interference with activity No limitations No limitations Minor limitations  SABA use 0-2 days/wk 0-2 days/wk 0-2 days/wk  Exacerbations requiring oral steroids 0-1 / year 0-1 / year 0-1 / year  .  03/11/2013 Chief Complaint  Patient presents with  . 6 month follow up    Breathing doing well overall.  No SOB, wheezing, chest tightness, chest pain, or cough at this time.  Will need pulmonary clearance for bariatric surgery for DM.  Pt doing well lately.  No real cough or mucus Pt denies any significant sore throat, nasal congestion or excess secretions, fever, chills, sweats, unintended weight loss, pleurtic or exertional chest pain, orthopnea PND, or leg swelling Pt denies any increase in rescue therapy over baseline, denies waking up needing it or having any early am or nocturnal exacerbations of coughing/wheezing/or dyspnea. Pt also denies any obvious fluctuation in symptoms with  weather or environmental change or other alleviating or aggravating factors   Past Medical History  Diagnosis Date  . High blood pressure   . Childhood asthma   . Diabetes mellitus   . Seasonal allergies   . Sinus trouble      Family History  Problem Relation Age of Onset  . Allergies Mother   . Asthma Mother   .     Marland Kitchen Cancer Paternal Grandmother   . Cancer Maternal Grandmother     was a heavy smoker  . Cancer Maternal Grandfather     was a heavy smoker  . COPD Mother   . Diabetes type II Mother   . Diabetes type II Father      No Known Allergies   Outpatient Prescriptions Prior to Visit  Medication Sig Dispense Refill  . albuterol (PROVENTIL HFA;VENTOLIN HFA) 108 (90 BASE) MCG/ACT inhaler Inhale 2 puffs into the lungs every 6 (six) hours as needed for  wheezing.  6.7 g  0  . amLODipine (NORVASC) 10 MG tablet TAKE 1 TABLET EVERY DAY  30 tablet  0  . Calcium-Phosphorus-Vitamin D (GELATIN/CALCIUM/VITAMIN D PO) Take 2,000 Units by mouth daily.       . fluticasone (FLONASE) 50 MCG/ACT nasal spray Place 2 sprays into the nose daily.  16 g  2  . glimepiride (AMARYL) 4 MG tablet Take 4 mg by mouth 2 (two) times daily.        . hydrochlorothiazide (HYDRODIURIL) 25 MG tablet Take 25 mg by mouth daily.        . Liraglutide (VICTOZA) 18 MG/3ML SOLN Inject 1.8 mg into the skin daily.      Marland Kitchen losartan (COZAAR) 100 MG tablet TAKE 1 TABLET EVERY DAY  30 tablet  0  . metFORMIN (GLUCOPHAGE) 1000 MG tablet Take 1,000 mg by mouth 2 (two) times daily with a meal.        . mometasone (ASMANEX) 220 MCG/INH inhaler Inhale 2 puffs into the lungs daily.  1 Inhaler  11  . amoxicillin-clavulanate (AUGMENTIN) 875-125 MG per tablet Take 1 tablet by mouth 2 (two) times daily.  14 tablet  0  . azithromycin (ZITHROMAX) 250 MG tablet TAKE 2 TABLETS BY MOUTH ON DAY 1 THEN TAKE 1 TABLET DAILY FOR THE NEXT 4 DAYS  6 tablet  0  . predniSONE (DELTASONE) 10 MG  tablet Take 4 for two days three for two days two for two days one for two days  20 tablet  0  . predniSONE (DELTASONE) 10 MG tablet Take 4 tabs by mouth x 2 days, 3 tabs x 2 days, 2 tabs x 2 days, 1 tab x 2 days and stop.  20 tablet  0   No facility-administered medications prior to visit.      Review of Systems  Constitutional: Negative for diaphoresis, activity change, appetite change, fatigue and unexpected weight change.  HENT: Negative for hearing loss, nosebleeds, congestion, facial swelling, sneezing, mouth sores, trouble swallowing, neck pain, neck stiffness, dental problem, voice change, sinus pressure, tinnitus and ear discharge.   Eyes: Negative for photophobia, discharge, itching and visual disturbance.  Respiratory: Negative for apnea, cough, choking, chest tightness, shortness of breath, wheezing and stridor.    Cardiovascular: Negative for palpitations and leg swelling.  Gastrointestinal: Negative for nausea, vomiting, abdominal pain, constipation, blood in stool and abdominal distention.  Genitourinary: Negative for dysuria, urgency, frequency, hematuria, flank pain, decreased urine volume and difficulty urinating.  Musculoskeletal: Negative for back pain, joint swelling, arthralgias and gait problem.  Skin: Negative for color change and pallor.  Neurological: Negative for dizziness, tremors, seizures, syncope, speech difficulty, weakness, light-headedness and numbness.  Hematological: Negative for adenopathy. Does not bruise/bleed easily.  Psychiatric/Behavioral: Negative for confusion, sleep disturbance and agitation. The patient is not nervous/anxious.        Objective:   Physical Exam   Filed Vitals:   03/11/13 1356  BP: 102/62  Pulse: 88  Temp: 97.8 F (36.6 C)  TempSrc: Oral  Weight: 229 lb (103.874 kg)  SpO2: 95%    Gen: Pleasant, well-nourished, in no distress,  normal affect  ENT: No lesions,  mouth clear,  oropharynx clear,no postnasal drip  Neck: No JVD, no TMG, no carotid bruits  Lungs: No use of accessory muscles, no dullness to percussion  Cardiovascular: RRR, heart sounds normal, no murmur or gallops, no peripheral edema  Abdomen: soft and NT, no HSM,  BS normal  Musculoskeletal: No deformities, no cyanosis or clubbing  Neuro: alert, non focal  Skin: Warm, no lesions or rashes      Assessment & Plan:   Moderate persistent asthma Moderate persistent asthma stable at this time on inhaled steroid with no excess use of short acting bronchodilator Plan Maintain Asmanex 2 puff daily Albuterol as needed The patient can be cleared for planned gastric bypass surgery from a pulmonary standpoint Return one year sooner if necessary   Updated Medication List Outpatient Encounter Prescriptions as of 03/11/2013  Medication Sig Dispense Refill  . albuterol  (PROVENTIL HFA;VENTOLIN HFA) 108 (90 BASE) MCG/ACT inhaler Inhale 2 puffs into the lungs every 6 (six) hours as needed for wheezing.  6.7 g  0  . amLODipine (NORVASC) 10 MG tablet TAKE 1 TABLET EVERY DAY  30 tablet  0  . Calcium-Phosphorus-Vitamin D (GELATIN/CALCIUM/VITAMIN D PO) Take 2,000 Units by mouth daily.       . fluticasone (FLONASE) 50 MCG/ACT nasal spray Place 2 sprays into the nose daily.  16 g  2  . glimepiride (AMARYL) 4 MG tablet Take 4 mg by mouth 2 (two) times daily.        . hydrochlorothiazide (HYDRODIURIL) 25 MG tablet Take 25 mg by mouth daily.        . Liraglutide (VICTOZA) 18 MG/3ML SOLN Inject 1.8 mg into the skin daily.      Marland Kitchen losartan (COZAAR) 100  MG tablet TAKE 1 TABLET EVERY DAY  30 tablet  0  . metFORMIN (GLUCOPHAGE) 1000 MG tablet Take 1,000 mg by mouth 2 (two) times daily with a meal.        . mometasone (ASMANEX) 220 MCG/INH inhaler Inhale 2 puffs into the lungs daily.  1 Inhaler  11  . [DISCONTINUED] mometasone (ASMANEX) 220 MCG/INH inhaler Inhale 2 puffs into the lungs daily.  1 Inhaler  11  . [DISCONTINUED] amoxicillin-clavulanate (AUGMENTIN) 875-125 MG per tablet Take 1 tablet by mouth 2 (two) times daily.  14 tablet  0  . [DISCONTINUED] azithromycin (ZITHROMAX) 250 MG tablet TAKE 2 TABLETS BY MOUTH ON DAY 1 THEN TAKE 1 TABLET DAILY FOR THE NEXT 4 DAYS  6 tablet  0  . [DISCONTINUED] predniSONE (DELTASONE) 10 MG tablet Take 4 for two days three for two days two for two days one for two days  20 tablet  0  . [DISCONTINUED] predniSONE (DELTASONE) 10 MG tablet Take 4 tabs by mouth x 2 days, 3 tabs x 2 days, 2 tabs x 2 days, 1 tab x 2 days and stop.  20 tablet  0   No facility-administered encounter medications on file as of 03/11/2013.

## 2013-03-11 NOTE — Patient Instructions (Signed)
Stay on asmanex two puff daily You are cleared for planned gastric surgery Return 1 year or sooner as needed

## 2013-04-09 ENCOUNTER — Telehealth: Payer: Self-pay | Admitting: Critical Care Medicine

## 2013-04-09 MED ORDER — AZITHROMYCIN 250 MG PO TABS: ORAL_TABLET | ORAL | Status: DC

## 2013-04-09 MED ORDER — PREDNISONE 10 MG PO TABS: ORAL_TABLET | ORAL | Status: DC

## 2013-04-09 NOTE — Telephone Encounter (Signed)
Pt advised and rx sent. Ladislav Caselli, CMA  

## 2013-04-09 NOTE — Telephone Encounter (Signed)
Last OV 03/11/13. I spoke with the pt and he is c/o having chest congestion, wheezing, increased dry cough x 2 days. Pt denies any fever, SOB, chest pain, or productive cough at this time. The pt is going out of town and cannot come in for OV, but would like something called in to take with him in [place] symptoms get worse. Please advise. Carron Curie, CMA No Known Allergies

## 2013-04-09 NOTE — Telephone Encounter (Signed)
Call in :  Prednisone 10mg  Take 4 for two days three for two days two for two days one for two days  #20 Azithromycin 250mg  Take two once then one daily until gone  #6

## 2013-05-15 ENCOUNTER — Other Ambulatory Visit: Payer: Self-pay | Admitting: Family Medicine

## 2013-05-15 ENCOUNTER — Other Ambulatory Visit: Payer: Self-pay | Admitting: Critical Care Medicine

## 2013-05-16 ENCOUNTER — Other Ambulatory Visit: Payer: Self-pay | Admitting: Critical Care Medicine

## 2013-05-17 MED ORDER — ALBUTEROL SULFATE HFA 108 (90 BASE) MCG/ACT IN AERS: INHALATION_SPRAY | RESPIRATORY_TRACT | Status: DC

## 2013-05-17 NOTE — Telephone Encounter (Signed)
Received qvar refill request from pharm but this medication is not on pt's med list. Called, spoke with pt.  Pt states he doesn't need a qvar rx, but states pharm was supposed to send request for ventolin hfa. I have sent ventolin hfa rx to pharm and denied the qvar.  Pt aware and voiced no further questions or concerns at this time.

## 2013-05-27 ENCOUNTER — Telehealth: Payer: Self-pay | Admitting: Critical Care Medicine

## 2013-05-27 MED ORDER — PREDNISONE 10 MG PO TABS: ORAL_TABLET | ORAL | Status: DC

## 2013-05-27 MED ORDER — AMOXICILLIN-POT CLAVULANATE 875-125 MG PO TABS: 1.0000 | ORAL_TABLET | Freq: Two times a day (BID) | ORAL | Status: DC

## 2013-05-27 NOTE — Telephone Encounter (Signed)
Pt is aware of PW recs. Rx's have been sent in. 

## 2013-05-27 NOTE — Telephone Encounter (Signed)
Call in  Prednisone 10mg  Take 4 for two days three for two days two for two days one for two days #20 Augmentin 875 bid x 7days

## 2013-05-27 NOTE — Telephone Encounter (Signed)
Last OV 03-11-13. Pt states can only come to [place], cannot come tomorrow. Pt is c/o having a dry cough and wheezing x 3 days. He denies any fever, SOb, chest tightness. Pt states he has been using his albuterol inhaler more over the last few days. Pt is requesting rx be call in. Please advise. Carron Curie, CMA No Known Allergies

## 2013-06-17 ENCOUNTER — Telehealth: Payer: Self-pay | Admitting: Critical Care Medicine

## 2013-06-17 MED ORDER — AMOXICILLIN-POT CLAVULANATE 875-125 MG PO TABS: 1.0000 | ORAL_TABLET | Freq: Two times a day (BID) | ORAL | Status: DC

## 2013-06-17 NOTE — Telephone Encounter (Signed)
Send another round of augmentin 875 bid x 7days but he needs to schedule an appt soon

## 2013-06-17 NOTE — Telephone Encounter (Signed)
Pt advised and rx sent. Pt advised he needs an appt soon. He states he will have to call us back after his trip because he does not have his calender. I advised he needs to set an appt before any further meds can be sent. Pt states understanding. Carron Curie, CMA

## 2013-06-17 NOTE — Telephone Encounter (Signed)
ATC pt and NA. No vm to leave a message wcb

## 2013-06-17 NOTE — Telephone Encounter (Signed)
Last OV 03-11-13. Pt called in on 04-09-13 and was prescribed pred taper and zpak. Pt then called in on 05-27-13 and was prescribed pred taper and Augmentin. Pt states that he felt better after the last course of medication but over the last 2 days he feels like he is getting sick again. He is c/o having chest congestion, and increased dry cough. He states his symptoms are not as bad as they were but he is leaving today for a trip to [place] this afternoon for several days and is afraid he is going to get worse. He is taking mucinex without relief. He denies any SOB, wheezing, chest tightness, fever at this time.  I advised the pt that he really needs to come inf or OV. He states he is on his way to a dentist appt right now and then he will be leaving for his trip so he cannot come in. He is requesting something to be called in to take with him so if he gets worse he will have it on hand. Please advise. Carron Curie, CMA No Known Allergies

## 2013-06-24 ENCOUNTER — Encounter: Payer: Self-pay | Admitting: Pulmonary Disease

## 2013-06-24 ENCOUNTER — Telehealth: Payer: Self-pay | Admitting: Critical Care Medicine

## 2013-06-24 ENCOUNTER — Ambulatory Visit (INDEPENDENT_AMBULATORY_CARE_PROVIDER_SITE_OTHER): Payer: 59 | Admitting: Pulmonary Disease

## 2013-06-24 VITALS — BP 116/76 | HR 75 | Temp 97.7°F | Ht 70.0 in | Wt 230.0 lb

## 2013-06-24 DIAGNOSIS — J45909 Unspecified asthma, uncomplicated: Secondary | ICD-10-CM

## 2013-06-24 MED ORDER — PREDNISONE (PAK) 10 MG PO TABS: ORAL_TABLET | Freq: Every day | ORAL | Status: DC

## 2013-06-24 NOTE — Patient Instructions (Signed)
STOP taking Asmanex Start Advair 250/50 1 puff twice daily instead If no better in 3 days , start Prednisone 10 mg tabs  Take 2 tabs daily with food x 5ds, then 1 tab daily with food x 5ds  Inform your surgeon that you have started prednisone- you may need stress doses on day of surgery & they will have to monitor your sugars

## 2013-06-24 NOTE — Telephone Encounter (Signed)
I called and spoke with pt. He is going to start the advair today. i advised him if it helps then he is too call us for RX. Nothing further needed

## 2013-06-24 NOTE — Progress Notes (Signed)
   Subjective:    Patient ID: Mike Hayes, male    DOB: 02-10-1966, 47 y.o.   MRN: [id number]  HPI  47 y.o never smoker with Moderate persistent asthma  On asmanex  Pt called in on 04-09-13 and was prescribed pred taper and zpak. Pt then called in on 05-27-13 and was prescribed pred taper and Augmentin. Pt states that he felt better after the last course of medication but over the last 2 days he feels like he is getting sick again. He is c/o having chest congestion, and increased dry cough. He went on a trip to [place]. He is taking mucinex without relief.  Gastric bypass is planned for 12/17  No obvious trigger, no excess SABA use Prednisone does worsen his sugars   Past Medical History  Diagnosis Date  . High blood pressure   . Childhood asthma   . Diabetes mellitus   . Seasonal allergies   . Sinus trouble      Review of Systems neg for any significant sore throat, dysphagia, itching, sneezing, nasal congestion or excess/ purulent secretions, fever, chills, sweats, unintended wt loss, pleuritic or exertional cp, hempoptysis, orthopnea pnd or change in chronic leg swelling. Also denies presyncope, palpitations, heartburn, abdominal pain, nausea, vomiting, diarrhea or change in bowel or urinary habits, dysuria,hematuria, rash, arthralgias, visual complaints, headache, numbness weakness or ataxia.     Objective:   Physical Exam  Gen. Pleasant, obese, in no distress, normal affect ENT - no lesions, no post nasal drip, class 2-3 airway Neck: No JVD, no thyromegaly, no carotid bruits Lungs: no use of accessory muscles, no dullness to percussion, decreased with faint scattered rhonchi  Cardiovascular: Rhythm regular, heart sounds  normal, no murmurs or gallops, no peripheral edema Abdomen: soft and non-tender, no hepatosplenomegaly, BS normal. Musculoskeletal: No deformities, no cyanosis or clubbing Neuro:  alert, non focal, no tremors        Assessment & Plan:

## 2013-06-24 NOTE — Assessment & Plan Note (Signed)
STOP taking Asmanex Step up with  Advair 250/50 1 puff twice daily instead Important to control his flare prior to his planned surgery next week If no better in 3 days , start Prednisone 10 mg tabs  Take 2 tabs daily with food x 5ds, then 1 tab daily with food x 5ds  Inform your surgeon that you have started prednisone- you may need stress doses on day of surgery & they will have to monitor your sugars peri-operatively

## 2013-07-23 ENCOUNTER — Encounter: Payer: Self-pay | Admitting: Critical Care Medicine

## 2013-07-23 ENCOUNTER — Ambulatory Visit (INDEPENDENT_AMBULATORY_CARE_PROVIDER_SITE_OTHER): Payer: 59 | Admitting: Critical Care Medicine

## 2013-07-23 VITALS — BP 174/94 | HR 57 | Temp 98.3°F | Ht 70.0 in | Wt 216.0 lb

## 2013-07-23 DIAGNOSIS — J45909 Unspecified asthma, uncomplicated: Secondary | ICD-10-CM

## 2013-07-23 DIAGNOSIS — Z9884 Bariatric surgery status: Secondary | ICD-10-CM | POA: Insufficient documentation

## 2013-07-23 DIAGNOSIS — Z9889 Other specified postprocedural states: Secondary | ICD-10-CM | POA: Insufficient documentation

## 2013-07-23 NOTE — Patient Instructions (Signed)
Stay off asmanex for now Call if using rescue inhaler more than twice a week, waking up more than two times a month, or refilling more than two times a year your rescue inhaler Return 6 months

## 2013-07-23 NOTE — Progress Notes (Signed)
Subjective:    Patient ID: Mike Hayes, male    DOB: [birth year], 48 y.o.   MRN: [id number]  HPI    07/23/2013 Chief Complaint  Patient presents with  . 1 month follow up    Breathing is back to baseline.  No SOB, wheezing, chest tigtness/pain, or cough at this time.   Pt seen for acute ov 06/2013.  Now back to baseline.  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  No side effects. Has lost 14# so far. No dx OSA    Now is baseline. No wheeze or coughing.  Off until 08/14/13.   Still doing Dog work and plain clothes work.  Off asmanex .     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 High blood pressure   . Childhood asthma   . Diabetes mellitus   . Seasonal allergies   . Sinus trouble      Review of Systems  Constitutional:   No  weight loss, night sweats,  Fevers, chills, fatigue, lassitude. HEENT:   No headaches,  Difficulty swallowing,  Tooth/dental problems,  Sore throat,                No sneezing, itching, ear ache, nasal congestion, post nasal drip,   CV:  No chest pain,  Orthopnea, PND, swelling in lower extremities, anasarca, dizziness, palpitations  GI  No heartburn, indigestion, abdominal pain, nausea, vomiting, diarrhea, change in bowel habits, loss of appetite  Resp: No shortness of breath with exertion or at rest.  No excess mucus, no productive cough,  No non-productive cough,  No coughing up of blood.  No change in color of mucus.  No wheezing.  No chest wall deformity  Skin: no rash or lesions.  GU: no dysuria, change in color  of urine, no urgency or frequency.  No flank pain.  MS:  No joint pain or swelling.  No decreased range of motion.  No back pain.  Psych:  No change in mood or affect. No depression or anxiety.  No memory loss.      Objective:   Physical Exam  Filed Vitals:   07/23/13 1108  BP: 174/94  Pulse: 57  Temp: 98.3 F (36.8 C)  TempSrc: Oral  Height: 5\' 10"  (1.778 m)  Weight: 216 lb (97.977 kg)  SpO2: 97%    Gen: Pleasant, well-nourished, in no distress,  normal affect  ENT: No lesions,  mouth clear,  oropharynx clear, no postnasal drip  Neck: No JVD, no TMG, no carotid bruits  Lungs: No use of accessory muscles, no dullness to percussion, clear without rales or rhonchi  Cardiovascular: RRR, heart sounds normal, no murmur or gallops, no peripheral edema  Abdomen: soft and NT, no HSM,  BS normal  Musculoskeletal: No deformities, no cyanosis or clubbing  Neuro: alert, non focal  Skin: Warm, no lesions or rashes  No results found.        Assessment & Plan:   Moderate persistent asthma Moderate persistent asthma with environmental factors improved and now off inhaled  steroids Plan Maintain off Asmanex Used rescue inhaler as needed Call if rescue inhaler use increases   Updated Medication List Outpatient Encounter Prescriptions as of 07/23/2013  Medication Sig  . albuterol (VENTOLIN HFA) 108 (90 BASE) MCG/ACT inhaler USE 2 PUFFS EVERY 4 TO 6 HOURS AS NEEDED  . calcium carbonate (TUMS - DOSED IN MG ELEMENTAL CALCIUM) 500 MG chewable tablet Chew 2 tablets by mouth 3 (three) times daily.  . IRON PO Take 1 tablet by mouth 3 (three) times daily.  . Multiple Vitamin (MULTIVITAMIN) tablet Take 1 tablet by mouth daily.  Marland Kitchen omeprazole (PRILOSEC) 20 MG capsule Take 20 mg by mouth 2 (two) times daily.  . ursodiol (ACTIGALL) 250 MG tablet Take by mouth 2 (two) times daily.  . [DISCONTINUED] amLODipine (NORVASC) 10 MG tablet TAKE 1 TABLET EVERY DAY  . [DISCONTINUED] amLODipine  (NORVASC) 10 MG tablet ON HOLD  . [DISCONTINUED] Calcium-Phosphorus-Vitamin D (GELATIN/CALCIUM/VITAMIN D PO) Take 2,000 Units by mouth daily.   . [DISCONTINUED] Canagliflozin (INVOKANA) 100 MG TABS Take 100 mg by mouth daily.  . [DISCONTINUED] glimepiride (AMARYL) 4 MG tablet Take 4 mg by mouth 2 (two) times daily.    . [DISCONTINUED] hydrochlorothiazide (HYDRODIURIL) 25 MG tablet Take 25 mg by mouth daily.    . [DISCONTINUED] losartan (COZAAR) 100 MG tablet TAKE 1 TABLET EVERY DAY  . [DISCONTINUED] metFORMIN (GLUCOPHAGE) 1000 MG tablet Take 1,000 mg by mouth 2 (two) times daily with a meal.    . [DISCONTINUED] mometasone (ASMANEX) 220 MCG/INH inhaler Inhale 2 puffs into the lungs daily.  . [DISCONTINUED] predniSONE (STERAPRED UNI-PAK) 10 MG tablet Take by mouth daily. 1 po bid X5 days, then 1 po qd X5 days, then stop.

## 2013-07-23 NOTE — Assessment & Plan Note (Signed)
Moderate persistent asthma with environmental factors improved and now off inhaled steroids Plan Maintain off Asmanex Used rescue inhaler as needed Call if rescue inhaler use increases

## 2013-07-24 DIAGNOSIS — K912 Postsurgical malabsorption, not elsewhere classified: Secondary | ICD-10-CM | POA: Insufficient documentation

## 2013-09-02 ENCOUNTER — Telehealth: Payer: Self-pay | Admitting: Critical Care Medicine

## 2013-09-02 MED ORDER — AZITHROMYCIN 250 MG PO TABS: ORAL_TABLET | ORAL | Status: DC

## 2013-09-02 NOTE — Telephone Encounter (Signed)
Call in azithromycin 250mg Take two once then one daily until gone #6 

## 2013-09-02 NOTE — Telephone Encounter (Signed)
Called and spoke with pt. C/o non productive and slight increase SOB w/ activity x 2 weeks. He has been taking OTC sudafed cold, mucinex and dayquil w/o relief. Please advise PW thanks  No Known Allergies

## 2013-09-02 NOTE — Telephone Encounter (Signed)
Called, spoke with pt. Informed him of below per PW.  He is aware rx sent to [facility] and is to call back if symptoms do not improve.

## 2013-09-07 ENCOUNTER — Other Ambulatory Visit: Payer: Self-pay | Admitting: Critical Care Medicine

## 2013-09-11 ENCOUNTER — Other Ambulatory Visit: Payer: Self-pay | Admitting: Critical Care Medicine

## 2013-09-11 NOTE — Telephone Encounter (Signed)
Called, spoke with pt as we received refill request for zpak. Pt states he did mean to request this but wanted in on Saturday when our office was closed.  Given office was closed, pt states he went to an Urgent Care for dry cough and some SOB.  Pt states they gave him some rxs, he can't remember the names, and is feeling better now.  Pt states he no longer needs rx for zpak.  He will call back if symptoms do not improve or worsen.  Nothing further needed at this time.

## 2013-09-13 NOTE — Telephone Encounter (Signed)
Called, spoke with pt as he had informed med warlier in the week that he did not need rx for zpak any longer.  Per Pt, he did not request this rx this time and still does NOT need zpak.  Advised I would call pharm this time.  He verbalized understanding and voiced no further questions or concerns at this time.  Called [facility], Spoke with Gerre PebblesGarrett.  Advised pt no longer is requesting zpak rx and this can be deleted.  He verbalized understanding and voiced no further questions or concerns at this time.

## 2013-10-10 DIAGNOSIS — J309 Allergic rhinitis, unspecified: Secondary | ICD-10-CM | POA: Insufficient documentation

## 2014-01-06 DIAGNOSIS — E663 Overweight: Secondary | ICD-10-CM | POA: Insufficient documentation

## 2014-01-13 ENCOUNTER — Other Ambulatory Visit: Payer: Self-pay | Admitting: Critical Care Medicine

## 2014-01-14 NOTE — Telephone Encounter (Signed)
Received refill request for asmanex.  Per pt's instructions from last OV with Dr. Delford FieldWright 07/23/2013:  Patient Instructions     Stay off asmanex for now  Call if using rescue inhaler more than twice a week, waking up more than two times a month, or refilling more than two times a year your rescue inhaler  Return 6 months   -----  lmomtcb for pt on home and cell #s - did pt request Asmanex?  IF so, did he stop it?

## 2014-01-16 NOTE — Telephone Encounter (Signed)
lmomtcb on pt's home and cell #s 

## 2014-01-21 ENCOUNTER — Telehealth: Payer: Self-pay | Admitting: Critical Care Medicine

## 2014-01-21 MED ORDER — MOMETASONE FUROATE 220 MCG/INH IN AEPB: 2.0000 | INHALATION_SPRAY | Freq: Every day | RESPIRATORY_TRACT | Status: DC

## 2014-01-21 NOTE — Telephone Encounter (Signed)
Pt aware RX has been sent in. Nothing further needed 

## 2014-01-21 NOTE — Telephone Encounter (Signed)
Received refill request for asmanex 2 puffs daily.  Pt was seen by PW on 07/23/13 with the following instructions:  Patient Instructions     Stay off asmanex for now  Call if using rescue inhaler more than twice a week, waking up more than two times a month, or refilling more than two times a year your rescue inhaler  Return 6 months   ----------  Called, spoke with pt.  Pt states he doesn't take the asmanex everyday but will take it for a few days when he notices a little wheezing and occasional SOB.  After taking the asmanex for a few days, symptoms resolve.  No cough or chest tightness.  Currently has no symptoms.  He would like rx incase it is needed.  Medication not on pt's current med list.  He is aware PW is out of office until next wk and is ok with waiting until then for a response as he is not currently needing to use it. If he does develop symptoms in the meantime, he is aware to call office back for recommendations.  Dr. Delford FieldWright, pls advise if this is ok for pt and if ok to send asmanex rx.  Thank you.

## 2014-01-21 NOTE — Telephone Encounter (Signed)
i am ok with the refill 

## 2014-01-21 NOTE — Telephone Encounter (Signed)
Spoke with pt - see phone sg from 01/21/14 for further information.   Will keep refill request open until response received from PW.

## 2014-01-22 NOTE — Telephone Encounter (Signed)
Rx sent via phone msg - pls see phone msg from 7/7/5 for additional information.

## 2014-03-13 ENCOUNTER — Ambulatory Visit (INDEPENDENT_AMBULATORY_CARE_PROVIDER_SITE_OTHER): Payer: 59 | Admitting: Critical Care Medicine

## 2014-03-13 ENCOUNTER — Encounter: Payer: Self-pay | Admitting: Critical Care Medicine

## 2014-03-13 VITALS — BP 156/84 | HR 74 | Temp 97.6°F | Ht 70.0 in | Wt 201.0 lb

## 2014-03-13 DIAGNOSIS — J45909 Unspecified asthma, uncomplicated: Secondary | ICD-10-CM

## 2014-03-13 MED ORDER — METHYLPREDNISOLONE ACETATE 80 MG/ML IJ SUSP
80.0000 mg | Freq: Once | INTRAMUSCULAR | Status: AC
  Administered 2014-03-13: 80 mg via INTRAMUSCULAR

## 2014-03-13 MED ORDER — MOMETASONE FUROATE 220 MCG/INH IN AEPB: 2.0000 | INHALATION_SPRAY | Freq: Every day | RESPIRATORY_TRACT | Status: DC

## 2014-03-13 NOTE — Progress Notes (Signed)
Subjective:    Patient ID: Mike Hayes, male    DOB: 1965/11/10, 48 y.o.   MRN: [id number]  HPI 03/13/2014 Chief Complaint  Patient presents with  . 6 month follow up    wheezing over the past few wks - mainly qhs and occas nonprod cough.  No SOB or chest tightness/pain.  Pt with URI or allergy, worse at night. Pt is outside a lot and mowing more.  No mucus, min cough.  Notes more wheezing.   PUL ASTHMA HISTORY 03/13/2014 07/23/2013 03/11/2013 08/23/2012 04/12/2012  Symptoms Daily 0-2 days/week 0-2 days/week Daily Daily  Nighttime awakenings 0-2/month 0-2/month 0-2/month 3-4/month 0-2/month  Interference with activity No limitations No limitations No limitations No limitations Minor limitations  SABA use 0-2 days/wk 0-2 days/wk 0-2 days/wk 0-2 days/wk 0-2 days/wk  Exacerbations requiring oral steroids 0-1 / year 0-1 / year 0-1 / year 0-1 / year 0-1 / year      Review of Systems Constitutional:   No  weight loss, night sweats,  Fevers, chills, fatigue, lassitude. HEENT:   No headaches,  Difficulty swallowing,  Tooth/dental problems,  Sore throat,                No sneezing, itching, ear ache, nasal congestion, post nasal drip,   CV:  No chest pain,  Orthopnea, PND, swelling in lower extremities, anasarca, dizziness, palpitations  GI  No heartburn, indigestion, abdominal pain, nausea, vomiting, diarrhea, change in bowel habits, loss of appetite  Resp: No shortness of breath with exertion or at rest.  No excess mucus, no productive cough,  No non-productive cough,  No coughing up of blood.  No change in color of mucus.  Notes  wheezing.  No chest wall deformity  Skin: no rash or lesions.  GU: no dysuria, change in color of urine, no urgency or frequency.  No flank pain.  MS:  No joint pain or swelling.  No decreased range of motion.  No back pain.  Psych:  No change in mood or affect. No depression or anxiety.  No memory loss.     Objective:   Physical Exam Filed Vitals:   03/13/14 1023  BP: 156/84  Pulse: 74  Temp: 97.6 F (36.4 C)  TempSrc: Oral  Height:  (1.778 m)  Weight: 201 lb (91.173 kg)  SpO2: 97%    Gen: Pleasant, well-nourished, in no distress,  normal affect  ENT: No lesions,  mouth clear,  oropharynx clear, no postnasal drip  Neck: No JVD, no TMG, no carotid bruits  Lungs: No use of accessory muscles, no dullness to percussion, expired wheezes  Cardiovascular: RRR, heart sounds normal, no murmur or gallops, no peripheral edema  Abdomen: soft and NT, no HSM,  BS normal  Musculoskeletal: No deformities, no cyanosis or clubbing  Neuro: alert, non focal  Skin: Warm, no lesions or rashes  No results found.        Assessment & Plan:   Moderate persistent asthma Moderate persistent asthma with acute flare Plan A Depomedrol  IM injection was given Stay on asmanex daily Return 6 months    Updated Medication List Outpatient Encounter Prescriptions as of 03/13/2014  Medication Sig  . albuterol (VENTOLIN HFA) 108 (90 BASE) MCG/ACT inhaler USE 2 PUFFS EVERY 4 TO 6 HOURS AS NEEDED  . Cholecalciferol (VITAMIN D) 1000 UNITS capsule Take 2,000 mg by mouth daily.  Marland Kitchen glimepiride (AMARYL) 1 MG tablet Take 1 tablet by mouth daily.  Marland Kitchen LOSARTAN POTASSIUM PO Take  1 tablet by mouth daily.  . metFORMIN (GLUCOPHAGE) 1000 MG tablet Take 1,000 mg by mouth 2 (two) times daily.  . mometasone (ASMANEX) 220 MCG/INH inhaler Inhale 2 puffs into the lungs daily.  . Multiple Vitamin (MULTIVITAMIN) tablet Take 1 tablet by mouth daily.  Marland Kitchen omeprazole (PRILOSEC) 20 MG capsule Take 20 mg by mouth 2 (two) times daily.  Marland Kitchen terbinafine (LAMISIL) 250 MG tablet Take 250 mg by mouth as needed.  . [DISCONTINUED] mometasone (ASMANEX) 220 MCG/INH inhaler Inhale 2 puffs into the lungs daily.  . [DISCONTINUED] mometasone (ASMANEX) 220 MCG/INH inhaler Inhale 2 puffs into the lungs daily as needed.  . [DISCONTINUED] azithromycin (ZITHROMAX) 250 MG tablet Take  2 once then one daily until gone  . [DISCONTINUED] calcium carbonate (TUMS - DOSED IN MG ELEMENTAL CALCIUM) 500 MG chewable tablet Chew 2 tablets by mouth 3 (three) times daily.  . [DISCONTINUED] IRON PO Take 1 tablet by mouth 3 (three) times daily.  . [DISCONTINUED] ursodiol (ACTIGALL) 250 MG tablet Take by mouth 2 (two) times daily.  . [EXPIRED] methylPREDNISolone acetate (DEPO-MEDROL) injection 80 mg

## 2014-03-13 NOTE — Patient Instructions (Signed)
A Depomedrol  IM injection was given Stay on asmanex daily Return 6 months

## 2014-03-14 NOTE — Assessment & Plan Note (Signed)
Moderate persistent asthma with acute flare Plan A Depomedrol  IM injection was given Stay on asmanex daily Return 6 months

## 2014-06-05 ENCOUNTER — Ambulatory Visit: Payer: 59 | Admitting: Internal Medicine

## 2014-06-06 ENCOUNTER — Encounter: Payer: Self-pay | Admitting: Internal Medicine

## 2014-06-06 ENCOUNTER — Ambulatory Visit (INDEPENDENT_AMBULATORY_CARE_PROVIDER_SITE_OTHER): Payer: 59 | Admitting: Internal Medicine

## 2014-06-06 VITALS — BP 158/90 | HR 80 | Temp 98.5°F | Ht 70.0 in | Wt 205.0 lb

## 2014-06-06 DIAGNOSIS — R059 Cough, unspecified: Secondary | ICD-10-CM

## 2014-06-06 DIAGNOSIS — R05 Cough: Secondary | ICD-10-CM

## 2014-06-06 DIAGNOSIS — J4540 Moderate persistent asthma, uncomplicated: Secondary | ICD-10-CM

## 2014-06-06 MED ORDER — METHYLPREDNISOLONE ACETATE 80 MG/ML IJ SUSP: 120.0000 mg | Freq: Once | INTRAMUSCULAR | Status: DC

## 2014-06-06 NOTE — Patient Instructions (Signed)
No change in maintenance meds  Depomedrol 120 today today  Work on inhaler technique:  relax and gently blow all the way out then take a nice smooth deep breath back in, triggering the inhaler at same time you start breathing in.  Hold for up to 5 seconds if you can.  Rinse and gargle with water when done  You now have your second flare in 6 months and may need step up therapy when you return

## 2014-06-06 NOTE — Progress Notes (Signed)
Subjective:    Patient ID: Mike Hayes, male    DOB: 1966/03/18, 48 y.o.   MRN: [id number]  HPI 03/13/2014 Chief Complaint  Patient presents with  . 6 month follow up    wheezing over the past few wks - mainly qhs and occas nonprod cough.  No SOB or chest tightness/pain.  Pt with URI or allergy, worse at night. Pt is outside a lot and mowing more.  No mucus, min cough.  Notes more wheezing.   PUL ASTHMA HISTORY 03/13/2014 07/23/2013 03/11/2013 08/23/2012 04/12/2012  Symptoms Daily 0-2 days/week 0-2 days/week Daily Daily  Nighttime awakenings 0-2/month 0-2/month 0-2/month 3-4/month 0-2/month  Interference with activity No limitations No limitations No limitations No limitations Minor limitations  SABA use 0-2 days/wk 0-2 days/wk 0-2 days/wk 0-2 days/wk 0-2 days/wk  Exacerbations requiring oral steroids 0-1 / year 0-1 / year 0-1 / year 0-1 / year 0-1 / year   06/06/2014 acute ov/[name] re: asthma flare Chief Complaint  Patient presents with  . Acute Visit    Pt c/o non prod cough, wheezing and increased SOB x 1 wk. He is using rescue inhaler at least once per day.      Assures me he does fine between exac on asthmanex but note last flare 3 m prior to OV  And in between does not need saba at all Acutely worse x one week and needing saba at least once per day and occ noct  No obvious patterns in day to day or daytime variabilty or assoc excess/ purulent sputum or cp or chest tightness, subjective wheeze overt sinus or hb symptoms. No unusual exp hx or h/o childhood pna/ asthma or knowledge of premature birth.  Sleeping ok without nocturnal  or early am exacerbation  of respiratory  c/o's or need for noct saba. Also denies any obvious fluctuation of symptoms with weather or environmental changes or other aggravating or alleviating factors except as outlined above   Current Medications, Allergies, Complete Past Medical History, Past Surgical History, Family History, and Social History were  reviewed in [facility] electronic medical record.  ROS  The following are not active complaints unless bolded sore throat, dysphagia, dental problems, itching, sneezing,  nasal congestion or excess/ purulent secretions, ear ache,   fever, chills, sweats, unintended wt loss, pleuritic or exertional cp, hemoptysis,  orthopnea pnd or leg swelling, presyncope, palpitations, heartburn, abdominal pain, anorexia, nausea, vomiting, diarrhea  or change in bowel or urinary habits, change in stools or urine, dysuria,hematuria,  rash, arthralgias, visual complaints, headache, numbness weakness or ataxia or problems with walking or coordination,  change in mood/affect or memory.                Objective:      Gen: Pleasant, well-nourished, in no distress,  normal affect  Wt Readings from Last 3 Encounters:  06/06/14 205 lb (92.987 kg)  03/13/14 201 lb (91.173 kg)  07/23/13 216 lb (97.977 kg)    Vital signs reviewed   ENT: No lesions,  mouth clear,  oropharynx clear, no postnasal drip  Neck: No JVD, no TMG, no carotid bruits  Lungs: No use of accessory muscles, no dullness to percussion,  Distant late bilateral exp wheeze  Cardiovascular: RRR, heart sounds normal, no murmur or gallops, no peripheral edema  Abdomen: soft and NT, no HSM,  BS normal  Musculoskeletal: No deformities, no cyanosis or clubbing  Neuro: alert, non focal  Skin: Warm, no lesions or rashes  Assessment & Plan:

## 2014-06-07 NOTE — Assessment & Plan Note (Addendum)
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  - flare 03/14/14 and 06/06/14 on asthmanex   DDX of  difficult airways management all start with A and  include Adherence, Ace Inhibitors, Acid Reflux, Active Sinus Disease, Alpha 1 Antitripsin deficiency, Anxiety masquerading as Airways dz,  ABPA,  allergy(esp in young), Aspiration (esp in elderly), Adverse effects of DPI,  Active smokers, plus two Bs  = Bronchiectasis and Beta blocker use..and one C= CHF  Adherence is always the initial "prime suspect" and is a multilayered concern that requires a "trust but verify" approach in every patient - starting with knowing how to use medications, especially inhalers, correctly, keeping up with refills and understanding the fundamental difference between maintenance and prns vs those medications only taken for a very short course and then stopped and not refilled.  - The proper method of use, as well as anticipated side effects, of a metered-dose inhaler are discussed and demonstrated to the patient. Improved effectiveness after extensive coaching during this visit to a level of approximately  90% - ? Really compliant with asthmanex, if so need to consider step up therapy as he now has second exac in 3 months.> has f/u with Dr Delford FieldWright to discuss.  ? Allergy > Depomedrol 120 IM at pt request   ? Acid (or non-acid) GERD > always difficult to exclude as up to 75% of pts in some series report no assoc GI/ Heartburn symptoms> rec continue max (24h)  acid suppression and diet restrictions/ reviewed      Each maintenance medication was reviewed in detail including most importantly the difference between maintenance and as needed and under what circumstances the prns are to be used.  Please see instructions for details which were reviewed in writing and the patient given a copy.

## 2014-07-13 ENCOUNTER — Other Ambulatory Visit: Payer: Self-pay | Admitting: Critical Care Medicine

## 2014-07-29 ENCOUNTER — Telehealth: Payer: Self-pay | Admitting: Critical Care Medicine

## 2014-07-29 MED ORDER — LEVOFLOXACIN 500 MG PO TABS: 500.0000 mg | ORAL_TABLET | Freq: Every day | ORAL | Status: DC

## 2014-07-29 MED ORDER — DM-GUAIFENESIN ER 30-600 MG PO TB12: 1.0000 | ORAL_TABLET | Freq: Four times a day (QID) | ORAL | Status: DC | PRN

## 2014-07-29 MED ORDER — PREDNISONE (PAK) 5 MG PO TABS: 5.0000 mg | ORAL_TABLET | ORAL | Status: DC

## 2014-07-29 MED ORDER — ALIGN 4 MG PO CAPS: 1.0000 | ORAL_CAPSULE | Freq: Every day | ORAL | Status: DC

## 2014-07-29 NOTE — Telephone Encounter (Signed)
Per SN-  Levaquin 500mg  1 tab qd #7 Align OTC 1 tab qd Mucinex 600mg  1 tab PO QID Increase fluid intake Pred dosepak 5mg , 6 day   Called and spoke to pt. Informed pt of the recs per SN. Pt requested even OTC meds sent in to allow the meds to be free of cost. All meds sent to preferred pharmacy. Pt verbalized understanding and denied any further questions or concerns at this time.

## 2014-07-29 NOTE — Telephone Encounter (Signed)
Pt c/o prod cough (yellow), wheezing, some sinus drainage in am's.  Denies sob, chest tightness, fever.  Pt has shoulder surgery in 2 weeks and wants to get this cleared up.  Please advise.  Will send to doc of day.  Allergies  Allergen Reactions  . Exenatide Other (See Comments)    Got knots and rash.    Current Outpatient Prescriptions on File Prior to Visit  Medication Sig Dispense Refill  . Cholecalciferol (VITAMIN D) 1000 UNITS capsule Take 2,000 mg by mouth daily.    Marland Kitchen. glimepiride (AMARYL) 1 MG tablet Take 1 tablet by mouth daily.    Marland Kitchen. LOSARTAN POTASSIUM PO Take 1 tablet by mouth daily.    . metFORMIN (GLUCOPHAGE) 1000 MG tablet Take 1,000 mg by mouth 2 (two) times daily.    . mometasone (ASMANEX) 220 MCG/INH inhaler Inhale 2 puffs into the lungs daily. 1 Inhaler 11  . Multiple Vitamin (MULTIVITAMIN) tablet Take 1 tablet by mouth daily.    Marland Kitchen. omeprazole (PRILOSEC) 20 MG capsule Take 20 mg by mouth 2 (two) times daily.    Marland Kitchen. terbinafine (LAMISIL) 250 MG tablet Take 250 mg by mouth as needed.    . VENTOLIN HFA 108 (90 BASE) MCG/ACT inhaler TAKE 2 PUFFS EVERY 4 TO 6 HOURS AS NEEDED 18 each 5   No current facility-administered medications on file prior to visit.

## 2014-08-26 ENCOUNTER — Telehealth: Payer: Self-pay | Admitting: Critical Care Medicine

## 2014-08-26 NOTE — Telephone Encounter (Signed)
Spoke with the pt  He is c/o non prod cough and wheezing for the past day or so  I have added him to TP on Thurs 2/11 at 2:45 pm  Pt advised to call or seek emergent care sooner if needed

## 2014-08-28 ENCOUNTER — Ambulatory Visit (INDEPENDENT_AMBULATORY_CARE_PROVIDER_SITE_OTHER): Payer: 59 | Admitting: Adult Health

## 2014-08-28 ENCOUNTER — Encounter: Payer: Self-pay | Admitting: Adult Health

## 2014-08-28 VITALS — BP 144/82 | HR 81 | Temp 97.7°F | Ht 70.0 in | Wt 198.0 lb

## 2014-08-28 DIAGNOSIS — J4541 Moderate persistent asthma with (acute) exacerbation: Secondary | ICD-10-CM

## 2014-08-28 MED ORDER — FAMOTIDINE 20 MG PO TABS: 20.0000 mg | ORAL_TABLET | Freq: Every evening | ORAL | Status: DC | PRN

## 2014-08-28 MED ORDER — CETIRIZINE HCL 10 MG PO TABS: 10.0000 mg | ORAL_TABLET | Freq: Every evening | ORAL | Status: DC | PRN

## 2014-08-28 NOTE — Progress Notes (Signed)
   Subjective:    Patient ID: Mike Hayes, male    DOB: [birth year], 49 y.o.   MRN: [id number]  HPI 03/13/2014 Chief Complaint  Patient presents with  . 6 month follow up    wheezing over the past few wks - mainly qhs and occas nonprod cough.  No SOB or chest tightness/pain.  Pt with URI or allergy, worse at night. Pt is outside a lot and mowing more.  No mucus, min cough.  Notes more wheezing.   PUL ASTHMA HISTORY 03/13/2014 07/23/2013 03/11/2013 08/23/2012 04/12/2012  Symptoms Daily 0-2 days/week 0-2 days/week Daily Daily  Nighttime awakenings 0-2/month 0-2/month 0-2/month 3-4/month 0-2/month  Interference with activity No limitations No limitations No limitations No limitations Minor limitations  SABA use 0-2 days/wk 0-2 days/wk 0-2 days/wk 0-2 days/wk 0-2 days/wk  Exacerbations requiring oral steroids 0-1 / year 0-1 / year 0-1 / year 0-1 / year 0-1 / year   06/06/2014 acute ov/Wert re: asthma flare Chief Complaint  Patient presents with  . Acute Visit    Pt c/o non prod cough, wheezing and increased SOB x 1 wk. He is using rescue inhaler at least once per day.      Assures me he does fine between exac on asthmanex but note last flare 3 m prior to OV  And in between does not need saba at all Acutely worse x one week and needing saba at least once per day and occ noct >>depo medrol inj  08/28/2014 Acute OV  Complains of dry cough, wheezing, and intermittent shortness of breath . Wheezing and cough are worse at bedtime. Patient will had similar symptoms one month ago with productive cough and was called in Levaquin and a prednisone taper. Says the symptoms got better up until week ago. He is on Asmanex, but only takes it on as-needed basis. He restarted this one week ago Has had recent shoulder surgery, and arm sling. Patient denies any hemoptysis, orthopnea, PND, leg swelling, fever, or chest pain  Current Medications, Allergies, Complete Past Medical History, Past Surgical History,  Family History, and Social History were reviewed in [facility] electronic medical record.  ROS  The following are not active complaints unless bolded sore throat, dysphagia, dental problems, itching, sneezing,  nasal congestion or excess/ purulent secretions, ear ache,   fever, chills, sweats, unintended wt loss, pleuritic or exertional cp, hemoptysis,  orthopnea pnd or leg swelling, presyncope, palpitations, heartburn, abdominal pain, anorexia, nausea, vomiting, diarrhea  or change in bowel or urinary habits, change in stools or urine, dysuria,hematuria,  rash, arthralgias, visual complaints, headache, numbness weakness or ataxia or problems with walking or coordination,  change in mood/affect or memory.                Objective:      Gen: Pleasant, well-nourished, in no distress,  normal affect   Vital signs reviewed   ENT: No lesions,  mouth clear,  oropharynx clear, no postnasal drip  Neck: No JVD, no TMG, no carotid bruits  Lungs: No use of accessory muscles, no dullness to percussion,  No wheezing   Cardiovascular: RRR, heart sounds normal, no murmur or gallops, no peripheral edema  Abdomen: soft and NT, no HSM,  BS normal  Musculoskeletal: No deformities, no cyanosis or clubbing  Neuro: alert, non focal  Skin: Warm, no lesions or rashes           Assessment & Plan:

## 2014-08-28 NOTE — Assessment & Plan Note (Signed)
Recurrent exacerbation Control for triggers  Plan  Hold Asmanex Begin Dulera 2 puffs twice daily until sample is gone then resume Asmanex Mucinex DM twice daily as needed for cough and congestion Begin Zyrtec 10 mg at bedtime for 5 days then as needed Begin Pepcid 20 mg at bedtime Please contact office for sooner follow up if symptoms do not improve or worsen or seek emergency care  Follow up Dr. Delford FieldWright  In 6-8 weeks and As needed

## 2014-08-28 NOTE — Patient Instructions (Addendum)
Hold Asmanex Begin Dulera 2 puffs twice daily until sample is gone then resume Asmanex Mucinex DM twice daily as needed for cough and congestion Begin Zyrtec 10 mg at bedtime for 5 days then as needed Begin Pepcid 20 mg at bedtime Please contact office for sooner follow up if symptoms do not improve or worsen or seek emergency care  Follow up Dr. Delford FieldWright  In 6-8 weeks and As needed

## 2014-10-06 ENCOUNTER — Telehealth: Payer: Self-pay | Admitting: Critical Care Medicine

## 2014-10-06 MED ORDER — PREDNISONE 10 MG PO TABS: ORAL_TABLET | ORAL | Status: DC

## 2014-10-06 NOTE — Telephone Encounter (Signed)
Called made pt aware of recs. RX sent in. Nothing further needed 

## 2014-10-06 NOTE — Telephone Encounter (Signed)
Called and spoke to pt. Pt is seeing PW on Thursday 3/24 in [place] for 6 week f/u. Pt is requesting recs sooner for recent symptoms. Pt c/o cough with little mucus production, unsure of color, rhinorrhea, sneezing, wheezing per pt, chest congestion x 3-4 days. Pt denies a change in SOB from baseline, CP/tightness, f/c/s. Pt stated he is going out of town on Friday 3/25 and would rather start treatment now if necessary. Pt stated he is taking pseudoephedrine, mucinex, flonase and it is not helping. PW unavailable today, will send to doc of day.   MW please advise.   Allergies  Allergen Reactions  . Exenatide Other (See Comments)    Got knots and rash.

## 2014-10-06 NOTE — Telephone Encounter (Signed)
Prednisone 10 mg take  4 each am x 2 days,   2 each am x 2 days,  1 each am x 2 days and stop - be sure to keep appt with Dr Delford FieldWright though

## 2014-10-09 ENCOUNTER — Ambulatory Visit (INDEPENDENT_AMBULATORY_CARE_PROVIDER_SITE_OTHER): Payer: 59 | Admitting: Critical Care Medicine

## 2014-10-09 ENCOUNTER — Encounter: Payer: Self-pay | Admitting: Critical Care Medicine

## 2014-10-09 VITALS — BP 173/100 | HR 92 | Temp 97.8°F | Ht 70.0 in | Wt 200.0 lb

## 2014-10-09 DIAGNOSIS — J4541 Moderate persistent asthma with (acute) exacerbation: Secondary | ICD-10-CM | POA: Diagnosis not present

## 2014-10-09 MED ORDER — BUDESONIDE-FORMOTEROL FUMARATE 160-4.5 MCG/ACT IN AERO: 2.0000 | INHALATION_SPRAY | Freq: Two times a day (BID) | RESPIRATORY_TRACT | Status: DC

## 2014-10-09 MED ORDER — FLUTICASONE PROPIONATE 50 MCG/ACT NA SUSP: 2.0000 | Freq: Every day | NASAL | Status: DC

## 2014-10-09 NOTE — Progress Notes (Signed)
Subjective:    Patient ID: Mike PummelWilliam R Mendell, male    DOB: 1966-01-18, 49 y.o.   MRN: [id number]  HPI 10/09/2014 Chief Complaint  Patient presents with  . Follow-up    breathing doing well.  Coughing up yellow mucus.  PND   Pt ill in 08/2014 and rx pred and better then worse .  Every month gets a uri and hacky and then worse. Notes some pndrip.  No wh eeze.  Notes yellow mucus No dyspnea.  Rx pred 442211 pulse taper. Out of work now d/t shoulder surgery, plans to return early April   Current Medications, Allergies, Complete Past Medical History, Past Surgical History, Family History, and Social History were reviewed in [facility] electronic medical record.  ROS  The following are not active complaints unless bolded sore throat, dysphagia, dental problems, itching, sneezing,  nasal congestion or excess/ purulent secretions, ear ache,   fever, chills, sweats, unintended wt loss, pleuritic or exertional cp, hemoptysis,  orthopnea pnd or leg swelling, presyncope, palpitations, heartburn, abdominal pain, anorexia, nausea, vomiting, diarrhea  or change in bowel or urinary habits, change in stools or urine, dysuria,hematuria,  rash, arthralgias, visual complaints, headache, numbness weakness or ataxia or problems with walking or coordination,  change in mood/affect or memory.     Objective:    BP 173/100 mmHg  Pulse 92  Temp(Src) 97.8 F (36.6 C) (Oral)  Ht 5\' 10"  (1.778 m)  Wt 200 lb (90.719 kg)  BMI 28.70 kg/m2  SpO2 96%   Gen: Pleasant, well-nourished, in no distress,  normal affect   Vital signs reviewed   ENT: No lesions,  mouth clear,  oropharynx clear, no postnasal drip  Neck: No JVD, no TMG, no carotid bruits  Lungs: No use of accessory muscles, no dullness to percussion, exp wheezes, poor airflow  Cardiovascular: RRR, heart sounds normal, no murmur or gallops, no peripheral edema  Abdomen: soft and NT, no HSM,  BS normal  Musculoskeletal: No deformities, no cyanosis  or clubbing  Neuro: alert, non focal  Skin: Warm, no lesions or rashes  Assessment & Plan:   Moderate persistent asthma Mod persistent asthma, severe atopy with spring allergies Plan Start flonase two puff daily ea nare Stop asmanex Start symbicort two puff twice daily Finish prednisone  Return 2 months     Updated Medication List Outpatient Encounter Prescriptions as of 10/09/2014  Medication Sig  . cetirizine (ZYRTEC ALLERGY) 10 MG tablet Take 1 tablet (10 mg total) by mouth at bedtime as needed.  . Cholecalciferol (VITAMIN D) 1000 UNITS capsule Take 2,000 mg by mouth daily.  Marland Kitchen. glimepiride (AMARYL) 1 MG tablet Take 1 tablet by mouth daily.  Marland Kitchen. losartan (COZAAR) 100 MG tablet Take 100 mg by mouth daily.  . metFORMIN (GLUCOPHAGE) 1000 MG tablet Take 1,000 mg by mouth 2 (two) times daily.  . Multiple Vitamin (MULTIVITAMIN) tablet Take 1 tablet by mouth daily.  Marland Kitchen. omeprazole (PRILOSEC) 20 MG capsule Take 20 mg by mouth 2 (two) times daily.  . predniSONE (DELTASONE) 10 MG tablet Take 4 tabs daily x 2 days, 2 tabs daily x 2 days, 1 tab daily x 2 days  . terbinafine (LAMISIL) 250 MG tablet Take 250 mg by mouth as needed.  . VENTOLIN HFA 108 (90 BASE) MCG/ACT inhaler TAKE 2 PUFFS EVERY 4 TO 6 HOURS AS NEEDED  . [DISCONTINUED] LOSARTAN POTASSIUM PO Take 1 tablet by mouth daily.  . [DISCONTINUED] mometasone (ASMANEX) 220 MCG/INH inhaler Inhale 2 puffs into the  lungs daily.  . budesonide-formoterol (SYMBICORT) 160-4.5 MCG/ACT inhaler Inhale 2 puffs into the lungs 2 (two) times daily.  . fluticasone (FLONASE) 50 MCG/ACT nasal spray Place 2 sprays into both nostrils daily.  . [DISCONTINUED] famotidine (PEPCID) 20 MG tablet Take 1 tablet (20 mg total) by mouth at bedtime as needed for heartburn or indigestion. (Patient not taking: Reported on 10/09/2014)

## 2014-10-09 NOTE — Patient Instructions (Signed)
Start flonase two puff daily ea nare Stop asmanex Start symbicort two puff twice daily Finish prednisone  Return 2 months

## 2014-10-10 NOTE — Assessment & Plan Note (Signed)
Mod persistent asthma, severe atopy with spring allergies Plan Start flonase two puff daily ea nare Stop asmanex Start symbicort two puff twice daily Finish prednisone  Return 2 months

## 2014-10-14 ENCOUNTER — Other Ambulatory Visit: Payer: Self-pay | Admitting: *Deleted

## 2014-10-14 ENCOUNTER — Telehealth: Payer: Self-pay | Admitting: *Deleted

## 2014-10-14 MED ORDER — MOMETASONE FURO-FORMOTEROL FUM 200-5 MCG/ACT IN AERO: 2.0000 | INHALATION_SPRAY | Freq: Two times a day (BID) | RESPIRATORY_TRACT | Status: DC

## 2014-10-14 NOTE — Telephone Encounter (Signed)
Rejection sent by [facility] ins denied Symbicort not covered, but [facility] is. Dr Delford FieldWright [facility] medication [facility] 2 puffs bid with  6 refills. Dulera was sent.

## 2015-08-03 ENCOUNTER — Encounter: Payer: Self-pay | Admitting: Adult Health

## 2015-08-03 ENCOUNTER — Ambulatory Visit (INDEPENDENT_AMBULATORY_CARE_PROVIDER_SITE_OTHER)
Admission: RE | Admit: 2015-08-03 | Discharge: 2015-08-03 | Disposition: A | Discharge status: Home / Self Care | Payer: 59 | Source: Ambulatory Visit | Attending: Adult Health | Admitting: Adult Health

## 2015-08-03 ENCOUNTER — Other Ambulatory Visit (INDEPENDENT_AMBULATORY_CARE_PROVIDER_SITE_OTHER): Payer: 59

## 2015-08-03 ENCOUNTER — Ambulatory Visit (INDEPENDENT_AMBULATORY_CARE_PROVIDER_SITE_OTHER): Payer: 59 | Admitting: Adult Health

## 2015-08-03 VITALS — BP 158/92 | HR 66 | Temp 97.8°F | Ht 70.0 in | Wt 208.0 lb

## 2015-08-03 DIAGNOSIS — J4540 Moderate persistent asthma, uncomplicated: Secondary | ICD-10-CM

## 2015-08-03 LAB — CBC WITH DIFFERENTIAL/PLATELET
BASOS ABS: 0 10*3/uL (ref 0.0–0.1)
Basophils Relative: 0.5 % (ref 0.0–3.0)
Eosinophils Absolute: 1.9 10*3/uL — ABNORMAL HIGH (ref 0.0–0.7)
Eosinophils Relative: 20.5 % — ABNORMAL HIGH (ref 0.0–5.0)
HCT: 48.7 % (ref 39.0–52.0)
Hemoglobin: 16 g/dL (ref 13.0–17.0)
LYMPHS ABS: 2.8 10*3/uL (ref 0.7–4.0)
Lymphocytes Relative: 29.3 % (ref 12.0–46.0)
MCHC: 32.9 g/dL (ref 30.0–36.0)
MCV: 85.9 fl (ref 78.0–100.0)
MONO ABS: 0.5 10*3/uL (ref 0.1–1.0)
MONOS PCT: 5.6 % (ref 3.0–12.0)
NEUTROS PCT: 44.1 % (ref 43.0–77.0)
Neutro Abs: 4.1 10*3/uL (ref 1.4–7.7)
Platelets: 138 10*3/uL — ABNORMAL LOW (ref 150.0–400.0)
RBC: 5.67 Mil/uL (ref 4.22–5.81)
RDW: 13.1 % (ref 11.5–15.5)
WBC: 9.4 10*3/uL (ref 4.0–10.5)

## 2015-08-03 LAB — NITRIC OXIDE: NITRIC OXIDE: 255

## 2015-08-03 MED ORDER — ALBUTEROL SULFATE HFA 108 (90 BASE) MCG/ACT IN AERS: INHALATION_SPRAY | RESPIRATORY_TRACT | Status: DC

## 2015-08-03 MED ORDER — LEVALBUTEROL HCL 0.63 MG/3ML IN NEBU
0.6300 mg | INHALATION_SOLUTION | Freq: Once | RESPIRATORY_TRACT | Status: AC
  Administered 2015-08-03: 0.63 mg via RESPIRATORY_TRACT

## 2015-08-03 MED ORDER — MOMETASONE FURO-FORMOTEROL FUM 200-5 MCG/ACT IN AERO: 2.0000 | INHALATION_SPRAY | Freq: Two times a day (BID) | RESPIRATORY_TRACT | Status: DC

## 2015-08-03 MED ORDER — PREDNISONE 10 MG PO TABS: ORAL_TABLET | ORAL | Status: DC

## 2015-08-03 NOTE — Addendum Note (Signed)
Addended by: Karalee HeightOX, Halayna Blane P on: 08/03/2015 05:17 PM   Modules accepted: Orders

## 2015-08-03 NOTE — Progress Notes (Addendum)
Subjective:    Patient ID: Mike Hayes, male    DOB: 1965/09/29, 50 y.o.   MRN: [id number]  HPI 50 yo male never smoker with moderate persistent asthma with severe atopy with spring allergies  Hx of gastric bypass   TEST  RAST + , IgE 261   08/03/2015 Acute OV  Pt presents for an acute office visit.  Complains of chest congestion/tightness, non prod cough, increased SOB and wheezing x 1 week.  Denies any sinus pressure/drainage, fever, nausea or vomiting. Has been off Dulera for several months.  Previous RAST testing ++ w/ elevated IgE.  Spirometry today shows FEV1 44%, ratio 77, FVC 46%.  FENO testing very high at 255.  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 No basement.  Has dog. Previous RAST test neg for dog.   denies any hemoptysis, orthopnea, PND, or increased leg swelling.    has Diabetes mellitus; High blood pressure; Seasonal allergies; Moderate persistent asthma; S/P laparoscopic sleeve gastrectomy; Morbid obesity (HCC); Allergic rhinitis; H/O surgical procedure; Overweight; Other and unspecified postsurgical nonabsorption; and Hypoglycemia following gastrointestinal surgery on his problem list.   Past Medical History  Diagnosis Date  . High blood pressure   . Childhood asthma   . Diabetes mellitus   . Seasonal allergies   . Sinus trouble    Current Outpatient Prescriptions on File Prior to Visit  Medication Sig Dispense Refill  . Cholecalciferol (VITAMIN D) 1000 UNITS capsule Take 2,000 mg by mouth daily.    Marland Kitchen glimepiride (AMARYL) 1 MG tablet Take 1 tablet by mouth daily.    Marland Kitchen losartan (COZAAR) 100 MG tablet Take 100 mg by mouth daily.    . metFORMIN (GLUCOPHAGE) 1000 MG tablet Take 1,000 mg by mouth 2 (two) times daily.    Marland Kitchen terbinafine (LAMISIL) 250 MG tablet Take 250 mg by mouth as needed.    . VENTOLIN HFA 108 (90 BASE) MCG/ACT inhaler TAKE 2 PUFFS EVERY 4 TO 6 HOURS AS NEEDED 18 each 5   No current facility-administered medications on file prior  to visit.   Past Surgical History  Procedure Laterality Date  . Shoulder surgery  dec. 2011    right shoulder  . Wisdon teeth  1998      Review of Systems Constitutional:   No  weight loss, night sweats,  Fevers, chills, fatigue, or  lassitude.  HEENT:   No headaches,  Difficulty swallowing,  Tooth/dental problems, or  Sore throat,                No sneezing, itching, ear ache,  +nasal congestion, post nasal drip,   CV:  No chest pain,  Orthopnea, PND, swelling in lower extremities, anasarca, dizziness, palpitations, syncope.   GI  No heartburn, indigestion, abdominal pain, nausea, vomiting, diarrhea, change in bowel habits, loss of appetite, bloody stools.   Resp:    No chest wall deformity  Skin: no rash or lesions.  GU: no dysuria, change in color of urine, no urgency or frequency.  No flank pain, no hematuria   MS:  No joint pain or swelling.  No decreased range of motion.  No back pain.  Psych:  No change in mood or affect. No depression or anxiety.  No memory loss.         Objective:   Physical Exam Filed Vitals:   08/03/15 1536  Pulse: 66  Temp: 97.8 F (36.6 C)  TempSrc: Oral  Height: 5\' 10"  (1.778 m)  Weight: 208 lb (  94.348 kg)  SpO2: 95%   Body mass index is 29.84 kg/(m^2).  GEN: A/Ox3; pleasant , NAD, well nourished   HEENT:  [place]/AT,  EACs-clear, TMs-wnl, NOSE-clear, THROAT-clear, no lesions, no postnasal drip or exudate noted.   NECK:  Supple w/ fair ROM; no JVD; normal carotid impulses w/o bruits; no thyromegaly or nodules palpated; no lymphadenopathy.  RESP  Exp wheezing noted .no accessory muscle use, no dullness to percussion  CARD:  RRR, no m/r/g  , no peripheral edema, pulses intact, no cyanosis or clubbing.  GI:   Soft & nt; nml bowel sounds; no organomegaly or masses detected.  Musco: Warm bil, no deformities or joint swelling noted.   Neuro: alert, no focal deficits noted.    Skin: Warm, no lesions or rashes          Assessment & Plan:

## 2015-08-03 NOTE — Addendum Note (Signed)
Addended by: Karalee HeightOX, Arlie Riker P on: 08/03/2015 04:50 PM   Modules accepted: Orders

## 2015-08-03 NOTE — Patient Instructions (Addendum)
Prednisone taper over the next week.  Restart Dulera 2 puffs twice daily, rinse after use. Chest x-ray and Labs today  Follow-up in 4  weeks with Dr. Kendrick FriesMcQuaid or Ricca Melgarejo NP  With PFT and as needed Please contact office for sooner follow up if symptoms do not improve or worsen or seek emergency care   follow with primary care physician as your blood pressure remains elevated today.

## 2015-08-03 NOTE — Assessment & Plan Note (Signed)
Exacerbation w/ allergic component Very high nitric oxide test , spirometry shows severe restriction .  Will have him return for PFT in 4 weeks  tx w/ steroid taper and restart Dulera  Repeat RAST /IgE . Check cbc w/ diff.  May need allergy referral   Plan  Prednisone taper over the next week.  Restart Dulera 2 puffs twice daily, rinse after use. Chest x-ray and Labs today  Follow-up in 4  weeks with Dr. Kendrick FriesMcQuaid or Anyi Fels NP  With PFT and as needed Please contact office for sooner follow up if symptoms do not improve or worsen or seek emergency care   follow with primary care physician as your blood pressure remains elevated today.

## 2015-08-04 NOTE — Progress Notes (Signed)
Quick Note:  LVM for pt to return call ______ 

## 2015-08-05 ENCOUNTER — Telehealth: Payer: Self-pay | Admitting: Adult Health

## 2015-08-05 NOTE — Progress Notes (Signed)
Shuffler reviewed, agree with this management May need to consider biologic therapy if repeated exacerbations

## 2015-08-05 NOTE — Telephone Encounter (Signed)
Spoke with pt and notified of results per TP. Pt verbalized understanding and denied any questions. 

## 2015-08-05 NOTE — Telephone Encounter (Signed)
Result Notes     Notes Recorded by Karalee Height, CMA on 08/04/2015 at 11:57 AM LVM for pt to return call. ------  Notes Recorded by Julio Sicks, NP on 08/04/2015 at 9:55 AM CXR is ok , cont w/ ov recs Please contact office for sooner follow up if symptoms do not improve or worsen or seek emergency care     lmtcb x1 for pt.

## 2015-08-06 LAB — RESPIRATORY ALLERGY PROFILE REGION II ~~LOC~~
ALLERGEN, CEDAR TREE, T6: 0.51 kU/L — AB
ALLERGEN, COMM SILVER BIRCH, T3: 0.1 kU/L — AB
Allergen, D pternoyssinus,d7: 0.48 kU/L — ABNORMAL HIGH
Allergen, Mulberry, t76: 0.13 kU/L — ABNORMAL HIGH
Alternaria Alternata: 0.22 kU/L — ABNORMAL HIGH
Aspergillus fumigatus, m3: 0.58 kU/L — ABNORMAL HIGH
BERMUDA GRASS: 0.87 kU/L — AB
Box Elder IgE: 0.11 kU/L — ABNORMAL HIGH
CAT DANDER: 0.14 kU/L — AB
Cladosporium Herbarum: 0.57 kU/L — ABNORMAL HIGH
Cockroach: 0.23 kU/L — ABNORMAL HIGH
Common Ragweed: 2.87 kU/L — ABNORMAL HIGH
D. farinae: 8.3 kU/L — ABNORMAL HIGH
DOG DANDER: 0.17 kU/L — AB
IGE (IMMUNOGLOBULIN E), SERUM: 796 kU/L — AB (ref <115)
Johnson Grass: 1.05 kU/L — ABNORMAL HIGH
Oak: 0.14 kU/L — ABNORMAL HIGH
Pecan/Hickory Tree IgE: 0.1 kU/L — ABNORMAL HIGH
Penicillium Notatum: 19.1 kU/L — ABNORMAL HIGH
Rough Pigweed  IgE: 0.11 kU/L — ABNORMAL HIGH
Sheep Sorrel IgE: 0.17 kU/L — ABNORMAL HIGH
Timothy Grass: 2.48 kU/L — ABNORMAL HIGH

## 2015-08-06 NOTE — Progress Notes (Signed)
Quick Note:  LVM for patient to return call. ______ 

## 2015-08-06 NOTE — Progress Notes (Signed)
Favor allergy eval

## 2015-08-07 NOTE — Progress Notes (Signed)
Quick Note:  LVM for patient to return call. ______ 

## 2015-08-10 ENCOUNTER — Telehealth: Payer: Self-pay | Admitting: Adult Health

## 2015-08-10 NOTE — Telephone Encounter (Signed)
Notes Recorded by Karalee Height, CMA on 08/07/2015 at 11:55 AM LVM for patient to return call. Notes Recorded by Karalee Height, CMA on 08/06/2015 at 9:35 AM LVM for patient to return call. Notes Recorded by Julio Sicks, NP on 08/05/2015 at 10:40 PM Allergy profile is very positive with high IgE  Eosinophils are very high as well.  May need to consider allergy referral vs begin therapy.  Cont w/ ov recs  follow up for PFT and will discuss these results in more detail and treatment options -------------------------------- Pt is aware of results. States that he has an appointment with TP soon. Will discuss options with her then.

## 2015-08-27 DIAGNOSIS — E559 Vitamin D deficiency, unspecified: Secondary | ICD-10-CM | POA: Insufficient documentation

## 2015-08-27 DIAGNOSIS — J329 Chronic sinusitis, unspecified: Secondary | ICD-10-CM | POA: Insufficient documentation

## 2015-09-02 ENCOUNTER — Ambulatory Visit: Payer: 59 | Admitting: Adult Health

## 2015-09-24 ENCOUNTER — Ambulatory Visit (INDEPENDENT_AMBULATORY_CARE_PROVIDER_SITE_OTHER): Payer: 59 | Admitting: Adult Health

## 2015-09-24 ENCOUNTER — Encounter: Payer: Self-pay | Admitting: Adult Health

## 2015-09-24 ENCOUNTER — Ambulatory Visit (INDEPENDENT_AMBULATORY_CARE_PROVIDER_SITE_OTHER): Payer: 59 | Admitting: Pulmonary Disease

## 2015-09-24 VITALS — HR 86 | Temp 98.0°F | Ht 69.5 in | Wt 208.0 lb

## 2015-09-24 DIAGNOSIS — J4540 Moderate persistent asthma, uncomplicated: Secondary | ICD-10-CM | POA: Diagnosis not present

## 2015-09-24 LAB — PULMONARY FUNCTION TEST
DL/VA % PRED: 143 %
DL/VA: 6.65 ml/min/mmHg/L
DLCO COR % PRED: 134 %
DLCO COR: 42.67 ml/min/mmHg
DLCO UNC % PRED: 139 %
DLCO unc: 44.27 ml/min/mmHg
FEF 25-75 POST: 3.51 L/s
FEF 25-75 PRE: 2.91 L/s
FEF2575-%CHANGE-POST: 20 %
FEF2575-%PRED-POST: 101 %
FEF2575-%PRED-PRE: 84 %
FEV1-%Change-Post: 5 %
FEV1-%Pred-Post: 97 %
FEV1-%Pred-Pre: 92 %
FEV1-Post: 3.78 L
FEV1-Pre: 3.59 L
FEV1FVC-%CHANGE-POST: 2 %
FEV1FVC-%PRED-PRE: 98 %
FEV6-%Change-Post: 4 %
FEV6-%PRED-PRE: 96 %
FEV6-%Pred-Post: 100 %
FEV6-Post: 4.82 L
FEV6-Pre: 4.63 L
FEV6FVC-%Change-Post: 0 %
FEV6FVC-%Pred-Post: 104 %
FEV6FVC-%Pred-Pre: 103 %
FVC-%Change-Post: 2 %
FVC-%PRED-POST: 96 %
FVC-%PRED-PRE: 93 %
FVC-POST: 4.82 L
FVC-PRE: 4.68 L
POST FEV1/FVC RATIO: 78 %
PRE FEV1/FVC RATIO: 77 %
Post FEV6/FVC ratio: 100 %
Pre FEV6/FVC Ratio: 99 %
RV % pred: 71 %
RV: 1.43 L
TLC % pred: 103 %
TLC: 7.1 L

## 2015-09-24 LAB — NITRIC OXIDE: NITRIC OXIDE: 39

## 2015-09-24 NOTE — Patient Instructions (Signed)
Continue on Dulera 2 puffs twice daily, rinse after use. Follow-up in 3- 4  Months with  Dr. Kendrick FriesMcQuaid  .

## 2015-09-24 NOTE — Progress Notes (Signed)
PFT done today. 

## 2015-09-24 NOTE — Progress Notes (Addendum)
Subjective:    Patient ID: Mike Hayes, male    DOB: [birth year], 50 y.o.   MRN: [id number]  HPI 50 yo male never smoker with moderate persistent asthma with severe atopy with spring allergies  Hx of gastric bypass   TEST  RAST + , IgE 261    09/24/2015  Follow up : Asthma  Pt returns for 2 month follow up .  Says he is doing very well on Dulera. No wheezing or sob.  No SABA use. Remains very active.  PFT today shows normal lung function with FEV1 97%, ratio 78, FVC 96%, DLCO 139%.  CXR last ov with no acute process.  Previous RAST testing very positive w/ elevated IgE. ,CBC shows very high eosinophils .  FENO testing very high at 255. Repeat FENO is much lower at 39 .  Take claritin. Daily .   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 No basement.  Denies any hemoptysis, orthopnea, PND, or increased leg swelling.    has Diabetes mellitus; High blood pressure; Seasonal allergies; Moderate persistent asthma; S/P laparoscopic sleeve gastrectomy; Morbid obesity (HCC); Allergic rhinitis; H/O surgical procedure; Overweight; Other and unspecified postsurgical nonabsorption; and Hypoglycemia following gastrointestinal surgery on his problem list.   Past Medical History  Diagnosis Date  . High blood pressure   . Childhood asthma   . Diabetes mellitus   . Seasonal allergies   . Sinus trouble    Current Outpatient Prescriptions on File Prior to Visit  Medication Sig Dispense Refill  . albuterol (VENTOLIN HFA) 108 (90 Base) MCG/ACT inhaler TAKE 2 PUFFS EVERY 4 TO 6 HOURS AS NEEDED 18 each 5  . Cholecalciferol (VITAMIN D) 1000 UNITS capsule Take 2,000 mg by mouth daily.    Marland Kitchen glimepiride (AMARYL) 1 MG tablet Take 1 tablet by mouth daily.    Marland Kitchen losartan (COZAAR) 100 MG tablet Take 100 mg by mouth daily.    . metFORMIN (GLUCOPHAGE) 1000 MG tablet Take 1,000 mg by mouth 2 (two) times daily.    . mometasone-formoterol (DULERA) 200-5 MCG/ACT AERO Inhale 2 puffs into the lungs 2 (two)  times daily. 1 Inhaler 5  . terbinafine (LAMISIL) 250 MG tablet Take 250 mg by mouth as needed.     No current facility-administered medications on file prior to visit.   Past Surgical History  Procedure Laterality Date  . Shoulder surgery  dec. 2011    right shoulder  . Wisdon teeth  1998      Review of Systems Constitutional:   No  weight loss, night sweats,  Fevers, chills, fatigue, or  lassitude.  HEENT:   No headaches,  Difficulty swallowing,  Tooth/dental problems, or  Sore throat,                No sneezing, itching, ear ache,  +nasal congestion, post nasal drip,   CV:  No chest pain,  Orthopnea, PND, swelling in lower extremities, anasarca, dizziness, palpitations, syncope.   GI  No heartburn, indigestion, abdominal pain, nausea, vomiting, diarrhea, change in bowel habits, loss of appetite, bloody stools.   Resp:    No chest wall deformity  Skin: no rash or lesions.  GU: no dysuria, change in color of urine, no urgency or frequency.  No flank pain, no hematuria   MS:  No joint pain or swelling.  No decreased range of motion.  No back pain.  Psych:  No change in mood or affect. No depression or anxiety.  No memory loss.  Objective:   Physical Exam Filed Vitals:   09/24/15 1601 09/24/15 1602  Pulse:  86  Temp: 98 F (36.7 C)   TempSrc: Oral   Height: 5' 9.5" (1.765 m)   Weight: 208 lb (94.348 kg)   SpO2:  95%   Body mass index is 30.29 kg/(m^2).  GEN: A/Ox3; pleasant , NAD, well nourished   HEENT:  [place]/AT,  EACs-clear, TMs-wnl, NOSE-clear, THROAT-clear, no lesions, no postnasal drip or exudate noted.   NECK:  Supple w/ fair ROM; no JVD; normal carotid impulses w/o bruits; no thyromegaly or nodules palpated; no lymphadenopathy.  RESP  CTA w /no wheezing .no accessory muscle use, no dullness to percussion  CARD:  RRR, no m/r/g  , no peripheral edema, pulses intact, no cyanosis or clubbing.  GI:   Soft & nt; nml bowel sounds; no organomegaly or  masses detected.  Musco: Warm bil, no deformities or joint swelling noted.   Neuro: alert, no focal deficits noted.    Skin: Warm, no lesions or rashes  Thoams Siefert NP-C  [facility]  09/24/15       Assessment & Plan:

## 2015-09-24 NOTE — Assessment & Plan Note (Addendum)
Well controlled on Dulera .  Pt has a very high FENO and RAST /eosinophil count >repeat FENO today sign improved down to 39.  However [facility] he has been very well controlled w/ no symptoms  Lung function is preserved.  For now hold on additonal therapy  Plan  [facility] .  Follow up in 4 months with Dr. Kendrick FriesMcQuaid.

## 2015-09-25 NOTE — Progress Notes (Signed)
Reviewed, agree.  PFT today was OK

## 2015-09-30 ENCOUNTER — Telehealth: Payer: Self-pay

## 2015-09-30 NOTE — Telephone Encounter (Signed)
Pt would like entire medical record.. Is planning to come in 10/01/15 to complete forms/  He needs these forms to turn into his [employer] benefits Rep.  Please call [phone] (H) [phone] (M) [phone] (W)

## 2015-11-09 ENCOUNTER — Telehealth: Payer: Self-pay | Admitting: Pulmonary Disease

## 2015-11-09 ENCOUNTER — Encounter: Payer: Self-pay | Admitting: *Deleted

## 2015-11-09 NOTE — Telephone Encounter (Signed)
lmtcb X1 for pt  

## 2015-11-10 NOTE — Telephone Encounter (Signed)
The policy and procedure was printed off for Amendment/Correction of Health Information.  Called spoke with patient and attempted to explain to him what is needed (ie forms, where to take the forms, etc) but pt was driving and unable to accept the information.  We can fax the P&P to him with all of the needed information tomorrow when he returns to work tomorrow morning at [employer].  Will call pt in the AM.  Routing back to triage.

## 2015-11-10 NOTE — Telephone Encounter (Signed)
Mike BumpsJessica has already spoken to pt to let him know that we are working on getting this info expunged from his chart.  Forwarding to Jess to follow up on.

## 2015-11-11 ENCOUNTER — Telehealth: Payer: Self-pay | Admitting: Pulmonary Disease

## 2015-11-11 NOTE — Telephone Encounter (Signed)
Pt calling back.Mike Hayes ° °

## 2015-11-11 NOTE — Telephone Encounter (Signed)
It sounds as thought the patient may have either allergic rhinitis or a developing sinusitis. Either way he needs to be evaluated by either us or his PCP. In the absence of infectious symptoms and with the short duration of symptoms no antibiotic is necessary at this time. JN

## 2015-11-11 NOTE — Telephone Encounter (Signed)
Received forms from MitchellJessica to give to patient. I will give forms to the nurse in the morning who is working with SG.

## 2015-11-11 NOTE — Telephone Encounter (Signed)
Called, spoke with pt.  C/o cough with yellow mucus, nasal congestion, and PND x 3 days.  Denies increased SOB, f/c/s, chest tightness, or CP.  Pt tried sudafed and mucinex with no relief.  Offered OV - pt declined and is requesting further recs.    Was seen last by TP on 3/917 with the following recs:  Patient Instructions     Continue on Dulera 2 puffs twice daily, rinse after use. Follow-up in 3- 4 Months with Dr. Kendrick FriesMcQuaid    Pt has a pending appt on 6/12/7 with BQ.   As Dr. Kendrick FriesMcQuaid is off, will send msg to DOD, Dr. Jamison NeighborNestor, please advise.  Thank you.  [facility]

## 2015-11-11 NOTE — Telephone Encounter (Signed)
Called and spoke with pt. Informed him of JN's rec. I scheduled him with SG on 11/12/15. He voiced understanding and had no further questions. Nothing further needed.

## 2015-11-12 ENCOUNTER — Encounter: Payer: Self-pay | Admitting: Acute Care

## 2015-11-12 ENCOUNTER — Ambulatory Visit (INDEPENDENT_AMBULATORY_CARE_PROVIDER_SITE_OTHER): Payer: 59 | Admitting: Acute Care

## 2015-11-12 VITALS — BP 120/80 | HR 85 | Ht 70.0 in | Wt 206.0 lb

## 2015-11-12 DIAGNOSIS — J302 Other seasonal allergic rhinitis: Secondary | ICD-10-CM | POA: Diagnosis not present

## 2015-11-12 DIAGNOSIS — J4540 Moderate persistent asthma, uncomplicated: Secondary | ICD-10-CM | POA: Diagnosis not present

## 2015-11-12 MED ORDER — PREDNISONE 10 MG PO TABS: ORAL_TABLET | ORAL | Status: DC

## 2015-11-12 MED ORDER — AZITHROMYCIN 250 MG PO TABS: ORAL_TABLET | ORAL | Status: DC

## 2015-11-12 NOTE — Patient Instructions (Addendum)
It is nice to meet you today. We will prescribe a prednisone taper today. Prednisone taper; 10 mg tablets: 4 tabs x 2 days, 3 tabs x 2 days, 2 tabs x 2 days 1 tab x 2 days then stop. Watch your blood sugars while on the prednisone. We will prescribe a z-pack for your sinus, URI. Take as directed. [facility] and your Albuterol as your rescue inhaler. Follow up in 2 weeks to make sure you are better. Follow up with McQuaid as scheduled in June. Please contact office for sooner follow up if symptoms do not improve or worsen or seek emergency care.

## 2015-11-12 NOTE — Progress Notes (Signed)
Subjective:    Patient ID: Mike Hayes, male    DOB: May 08, 1966, 50 y.o.   MRN: [id number]  HPI  50 yo male never  smoker with moderate persistent asthma with severe atopy with spring allergies  Hx of gastric bypass. Followed by Dr. Kendrick Fries   TEST  RAST + , IgE 261  09/24/2015: FEV1 97%, ratio 78, FVC 96%, DLCO 139%.    11/12/2015 Acute Office Visit:Patient presents to the office today with complaint of sinus drainage, and cough with yellow secretions. He states he has had fever. No chest tightness, chest pain, orthopnea, hemoptysis, calf or leg pain.Negative for sore throat or ear pain. States he wants to get ahead of this before it causes asthma flare.He is continuing to do well on [facility]. He is taking Claritin daily for allergies.    Current outpatient prescriptions:  .  albuterol (VENTOLIN HFA) 108 (90 Base) MCG/ACT inhaler, TAKE 2 PUFFS EVERY 4 TO 6 HOURS AS NEEDED, Disp: 18 each, Rfl: 5 .  Cholecalciferol (VITAMIN D) 1000 UNITS capsule, Take 2,000 mg by mouth daily., Disp: , Rfl:  .  glimepiride (AMARYL) 1 MG tablet, Take 1 tablet by mouth daily., Disp: , Rfl:  .  hydrochlorothiazide (MICROZIDE) 12.5 MG capsule, Take 12.5 mg by mouth daily., Disp: , Rfl:  .  losartan (COZAAR) 100 MG tablet, Take 100 mg by mouth daily., Disp: , Rfl:  .  metFORMIN (GLUCOPHAGE) 1000 MG tablet, Take 1,000 mg by mouth 2 (two) times daily., Disp: , Rfl:  .  mometasone-formoterol (DULERA) 200-5 MCG/ACT AERO, Inhale 2 puffs into the lungs 2 (two) times daily., Disp: 1 Inhaler, Rfl: 5 .  terbinafine (LAMISIL) 250 MG tablet, Take 250 mg by mouth as needed., Disp: , Rfl:  .  azithromycin (ZITHROMAX) 250 MG tablet, Take as directed, Disp: 6 tablet, Rfl: 0 .  predniSONE (DELTASONE) 10 MG tablet, Take  for 2 days, then  for 2 days,  for 2 days,  for 2 days, then stop, Disp: 20 tablet, Rfl: 0   Past Medical History  Diagnosis Date  . High blood pressure   . Childhood asthma   .  Diabetes mellitus   . Seasonal allergies   . Sinus trouble     Allergies  Allergen Reactions  . Exenatide Other (See Comments)    Got knots and rash.    Review of Systems Constitutional:   No  weight loss, night sweats, +  Fevers, + chills, +  fatigue, or  lassitude.  HEENT:   No headaches,  Difficulty swallowing,  Tooth/dental problems, or  Sore throat,                No sneezing, itching, ear ache, + nasal congestion,  No post nasal drip,   CV:  No chest pain,  Orthopnea, PND, swelling in lower extremities, anasarca, dizziness, palpitations, syncope.   GI  No heartburn, indigestion, abdominal pain, nausea, vomiting, diarrhea, change in bowel habits, loss of appetite, bloody stools.   Resp: No shortness of breath with exertion or at rest.  + excess mucus, + productive cough,  No non-productive cough,  No coughing up of blood.  +change in color of mucus.  No wheezing.  No chest wall deformity  Skin: no rash or lesions.  GU: no dysuria, change in color of urine, no urgency or frequency.  No flank pain, no hematuria   MS:  No joint pain or swelling.  No decreased range of motion.  No back pain.  Psych:  No change in mood or affect. No depression or anxiety.  No memory loss.        Objective:   Physical Exam BP 120/80 mmHg  Pulse 85  Ht 5\' 10"  (1.778 m)  Wt 206 lb (93.441 kg)  BMI 29.56 kg/m2  SpO2 97%   Physical Exam:  General- No distress,  A&Ox3 ENT: No sinus tenderness, TM clear, pale nasal mucosa, no oral exudate,no post nasal drip, no LAN Cardiac: S1, S2, regular rate and rhythm, no murmur Chest: No wheeze/ rales/ dullness; no accessory muscle use, no nasal flaring, no sternal retractions Abd.: Soft Non-tender Ext: No clubbing cyanosis, edema Neuro:  normal strength Skin: No rashes, warm and dry Psych: normal mood and behavior   Bevelyn NgoSarah F. Weylyn Ricciuti, AGACNP-BC [facility]  11/12/2015      Assessment & Plan:

## 2015-11-12 NOTE — Progress Notes (Signed)
Reviewed, agree 

## 2015-11-12 NOTE — Assessment & Plan Note (Signed)
Currently well controlled with daily claritin Plan: Continue daily claritin.

## 2015-11-12 NOTE — Assessment & Plan Note (Signed)
Mild flare Fever with purulent secretions [facility]  [facility] PFT's indicate normal lung function Plan: Prednisone taper; 10 mg tablets: 4 tabs x 2 days, 3 tabs x 2 days, 2 tabs x 2 days 1 tab x 2 days then stop. Watch blood sugars while on prednisone z-pack Continue your [facility] for asthma maintenence and your Albuterol as your rescue inhaler. Follow up in 2 weeks to make sure you are better. Follow up with McQuaid as scheduled in June. Please contact office for sooner follow up if symptoms do not improve or worsen or seek emergency care.

## 2015-11-12 NOTE — Telephone Encounter (Signed)
Gave forms to [place] to give to pt. Nothing further needed.

## 2015-11-26 ENCOUNTER — Ambulatory Visit: Payer: 59 | Admitting: Acute Care

## 2015-11-30 ENCOUNTER — Encounter: Payer: Self-pay | Admitting: Critical Care Medicine

## 2015-12-02 ENCOUNTER — Telehealth: Payer: Self-pay | Admitting: Critical Care Medicine

## 2015-12-02 NOTE — Telephone Encounter (Signed)
Amendment received Nov 17 2015. Forwarded to Shan LevansPatrick Wright MD [facility]

## 2015-12-28 ENCOUNTER — Ambulatory Visit: Payer: 59 | Admitting: Pulmonary Disease

## 2016-02-16 ENCOUNTER — Encounter: Payer: Self-pay | Admitting: Pulmonary Disease

## 2016-02-16 ENCOUNTER — Ambulatory Visit (INDEPENDENT_AMBULATORY_CARE_PROVIDER_SITE_OTHER): Payer: 59 | Admitting: Pulmonary Disease

## 2016-02-16 DIAGNOSIS — J4540 Moderate persistent asthma, uncomplicated: Secondary | ICD-10-CM

## 2016-02-16 NOTE — Assessment & Plan Note (Signed)
Zavior has moderate persistent asthma based on his symptoms. However, he's had excellent control since starting a combination inhale corticosteroid and long-acting beta agonist. At this time I see no indication to change his medicines considering the significant improvement.  That said, should he have recurrent exacerbations best on his chronic eosinophilia he would benefit from some of the newer biologic agents for asthma.  Plan: Continue Dulera at current dosing Pneumonia vaccine recommended, he refused Use albuterol as needed for shortness of breath I recommended he get a flu shot in the fall Follow-up in 6 months or sooner if needed

## 2016-02-16 NOTE — Patient Instructions (Signed)
Keep taking your medications as you're doing Get a flu shot in the fall Follow-up in 6 months, sooner if needed

## 2016-02-16 NOTE — Progress Notes (Signed)
Subjective:    Patient ID: Mike Hayes, male    DOB: 1965/12/27, 50 y.o.   MRN: [id number]  Synopsis: Former patient of Dr. Delford Hayes with severe asthma that falred up in 2009. 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  - flare 03/14/14 and 06/06/14 on asthmanex  >>RAST +++positive , IgE >700, +++eosinophils  08/04/15, high nitric oxide > 09/2015 PFTs > Ratio 78%, FEV1 3.78 L (78%, 5% change), FVC 4.82L (99% pred), TLC 7.0 L (103% pred), DLCO 44.6 (139% pred)  HPI Chief Complaint  Patient presents with  . Follow-up    pt c/o intermittent wheezing, nonprod cough worse X2-3 days.     Mike Hayes has significant asthma. It started in childhood which went away until it started again in 2009 with bad bronchitis.  He ended up seeing Mike Hayes.  He says that he has dyspnea from time to time, a dry cough from time to time and an occassional cough. He says that he will get bronchitis about 4-5 times a year usually around the change of the season.    He says that cold weather will sometimes make it worse.    Mike Hayes has really made a difference in his symptoms and he has not had a bad flare since then.  No prednisone since February.  He notes that with his DM2, his blood sugars get worse right away.   He has not used albuterol in months.   Past Medical History:  Diagnosis Date  . Childhood asthma   . Diabetes mellitus   . High blood pressure   . Seasonal allergies   . Sinus trouble       Review of Systems  Constitutional: Negative for chills, fatigue and fever.  HENT: Negative for postnasal drip, rhinorrhea and sinus pressure.   Respiratory: Positive for cough and wheezing. Negative for chest tightness and shortness of breath.   Cardiovascular: Negative for chest pain, palpitations and leg swelling.       Objective:   Physical Exam Vitals:   02/16/16 0939  BP: 136/78  Pulse: 65  SpO2: 97%  Weight: 208 lb (94.3  kg)  Height:  (1.778 m)    Gen: well appearing HENT: OP clear, TM's clear, neck supple PULM: exp wheezing R>L, normal percussion CV: RRR, no mgr, trace edema GI: BS+, soft, nontender Derm: no cyanosis or rash Psyche: normal mood and affect  Records from her nurse practitioner reviewed today in clinic where he was seen for asthma.     Assessment & Plan:  Moderate persistent asthma Mike Hayes has moderate persistent asthma based on his symptoms. However, he's had excellent control since starting a combination inhale corticosteroid and long-acting beta agonist. At this time I see no indication to change his medicines considering the significant improvement.  That said, should he have recurrent exacerbations best on his chronic eosinophilia he would benefit from some of the newer biologic agents for asthma.  Plan: Continue Dulera at current dosing Pneumonia vaccine recommended, he refused Use albuterol as needed for shortness of breath I recommended he get a flu shot in the fall Follow-up in 6 months or sooner if needed    Current Outpatient Prescriptions:  .  albuterol (VENTOLIN HFA) 108 (90 Base) MCG/ACT inhaler, TAKE 2 PUFFS EVERY 4 TO 6 HOURS AS NEEDED, Disp: 18 each, Rfl: 5 .  Cholecalciferol (VITAMIN D) 1000 UNITS capsule, Take 2,000 mg by mouth daily., Disp: , Rfl:  .  glimepiride (AMARYL)  1 MG tablet, Take 1 tablet by mouth daily., Disp: , Rfl:  .  hydrochlorothiazide (MICROZIDE) 12.5 MG capsule, Take 12.5 mg by mouth daily., Disp: , Rfl:  .  losartan (COZAAR) 100 MG tablet, Take 100 mg by mouth daily., Disp: , Rfl:  .  metFORMIN (GLUCOPHAGE) 1000 MG tablet, Take 1,000 mg by mouth 2 (two) times daily., Disp: , Rfl:  .  mometasone-formoterol (DULERA) 200-5 MCG/ACT AERO, Inhale 2 puffs into the lungs 2 (two) times daily., Disp: 1 Inhaler, Rfl: 5 .  terbinafine (LAMISIL) 250 MG tablet, Take 250 mg by mouth as needed., Disp: , Rfl:

## 2016-06-21 ENCOUNTER — Ambulatory Visit (INDEPENDENT_AMBULATORY_CARE_PROVIDER_SITE_OTHER): Payer: 59 | Admitting: Adult Health

## 2016-06-21 ENCOUNTER — Encounter: Payer: Self-pay | Admitting: Adult Health

## 2016-06-21 DIAGNOSIS — J4541 Moderate persistent asthma with (acute) exacerbation: Secondary | ICD-10-CM

## 2016-06-21 MED ORDER — ALBUTEROL SULFATE HFA 108 (90 BASE) MCG/ACT IN AERS: INHALATION_SPRAY | RESPIRATORY_TRACT | 1 refills | Status: DC

## 2016-06-21 MED ORDER — PREDNISONE 10 MG PO TABS: ORAL_TABLET | ORAL | 0 refills | Status: DC

## 2016-06-21 MED ORDER — AZITHROMYCIN 250 MG PO TABS: ORAL_TABLET | ORAL | 0 refills | Status: AC

## 2016-06-21 MED ORDER — LEVALBUTEROL HCL 0.63 MG/3ML IN NEBU
0.6300 mg | INHALATION_SOLUTION | Freq: Once | RESPIRATORY_TRACT | Status: AC
  Administered 2016-06-21: 0.63 mg via RESPIRATORY_TRACT

## 2016-06-21 NOTE — Addendum Note (Signed)
Addended by: Abigail MiyamotoPHELPS, DENISE D on: 06/21/2016 03:20 PM   Modules accepted: Orders

## 2016-06-21 NOTE — Patient Instructions (Addendum)
Zpack take as directed .  Prednisone taper over next week .  Mucinex DM Twice daily  As needed  Cough/congestion  Saline nasal rinses As needed   Zyrtec 10mg  At bedtime  As needed  Drainage.  Follow up Dr. Kendrick FriesMcQuaid in 3 months and As needed   Please contact office for sooner follow up if symptoms do not improve or worsen or seek emergency care

## 2016-06-21 NOTE — Addendum Note (Signed)
Addended by: Abigail MiyamotoPHELPS, DENISE D on: 06/21/2016 03:24 PM   Modules accepted: Orders

## 2016-06-21 NOTE — Assessment & Plan Note (Signed)
Flare with URI /bronchitis  xopenex neb x 1   Plan  Patient Instructions  Zpack take as directed .  Prednisone taper over next week .  Mucinex DM Twice daily  As needed  Cough/congestion  Saline nasal rinses As needed   Zyrtec 10mg  At bedtime  As needed  Drainage.  Follow up Dr. Kendrick FriesMcQuaid in 3 months and As needed   Please contact office for sooner follow up if symptoms do not improve or worsen or seek emergency care

## 2016-06-21 NOTE — Progress Notes (Signed)
Subjective:    Patient ID: Mike Hayes, male    DOB: 23-Dec-1965, 50 y.o.   MRN: [id number]  HPI  50  yo male never smoker with moderate persistent asthma with severe atopy with spring allergies  Hx of gastric bypass   TEST  RAST + , IgE 261 PFT today shows normal lung function with FEV1 97%, ratio 78, FVC 96%, DLCO 139%.  CXR last ov with no acute 2017 Previous RAST testing very positive w/ elevated IgE. ,CBC shows very high eosinophils .  FENO testing very high at 255. Repeat FENO is much lower at 39 .     06/21/2016  Acute OV : Asthma  Pt presents for an acute office visit.  Complains of 1 week of cough, congestion ,nasal drainage , wheezing an  dypsnea.  Has increased his SABA use. On Dulera Twice daily  . No missed doses .  He denies fever chest pain , orthopnea, edema or  Hemoptysis .  No recent abx use.  Cough is minimally productive. Hard to get up any mucus.      has Diabetes mellitus; High blood pressure; Seasonal allergies; Moderate persistent asthma; S/P laparoscopic sleeve gastrectomy; Morbid obesity (HCC); Allergic rhinitis; H/O surgical procedure; Overweight; Other and unspecified postsurgical nonabsorption; and Hypoglycemia following gastrointestinal surgery on his problem list.   Past Medical History:  Diagnosis Date  . Childhood asthma   . Diabetes mellitus   . High blood pressure   . Seasonal allergies   . Sinus trouble    Current Outpatient Prescriptions on File Prior to Visit  Medication Sig Dispense Refill  . albuterol (VENTOLIN HFA) 108 (90 Base) MCG/ACT inhaler TAKE 2 PUFFS EVERY 4 TO 6 HOURS AS NEEDED 18 each 5  . Cholecalciferol (VITAMIN D) 1000 UNITS capsule Take 2,000 mg by mouth daily.    Marland Kitchen. glimepiride (AMARYL) 1 MG tablet Take 1 tablet by mouth 2 (two) times daily.     . hydrochlorothiazide (MICROZIDE) 12.5 MG capsule Take 12.5 mg by mouth daily.    Marland Kitchen. losartan (COZAAR) 100 MG tablet Take 100 mg by mouth daily.    . metFORMIN (GLUCOPHAGE)  1000 MG tablet Take 1,000 mg by mouth 2 (two) times daily.    . mometasone-formoterol (DULERA) 200-5 MCG/ACT AERO Inhale 2 puffs into the lungs 2 (two) times daily. 1 Inhaler 5  . terbinafine (LAMISIL) 250 MG tablet Take 250 mg by mouth as needed.     No current facility-administered medications on file prior to visit.    Past Surgical History:  Procedure Laterality Date  . SHOULDER SURGERY  dec. 2011   right shoulder  . wisdon teeth  1998      Review of Systems Constitutional:   No  weight loss, night sweats,  Fevers, chills, fatigue, or  lassitude.  HEENT:   No headaches,  Difficulty swallowing,  Tooth/dental problems, or  Sore throat,                No sneezing, itching, ear ache,  +nasal congestion, post nasal drip,   CV:  No chest pain,  Orthopnea, PND, swelling in lower extremities, anasarca, dizziness, palpitations, syncope.   GI  No heartburn, indigestion, abdominal pain, nausea, vomiting, diarrhea, change in bowel habits, loss of appetite, bloody stools.   Resp:    No chest wall deformity  Skin: no rash or lesions.  GU: no dysuria, change in color of urine, no urgency or frequency.  No flank pain, no hematuria  MS:  No joint pain or swelling.  No decreased range of motion.  No back pain.  Psych:  No change in mood or affect. No depression or anxiety.  No memory loss.         Objective:   Physical Exam Vitals:   06/21/16 1437  BP: 140/70  Pulse: 83  Temp: 98.1 F (36.7 C)  TempSrc: Oral  SpO2: 94%  Weight: 204 lb 3.2 oz (92.6 kg)  Height: 5\' 11"  (1.803 m)   Body mass index is 28.48 kg/m.  GEN: A/Ox3; pleasant , NAD, well nourished    HEENT:  [place]/AT,  EACs-clear, TMs-wnl, NOSE-clear, THROAT-clear, no lesions, no postnasal drip or exudate noted.   NECK:  Supple w/ fair ROM; no JVD; normal carotid impulses w/o bruits; no thyromegaly or nodules palpated; no lymphadenopathy.    RESP  CTA w /no wheezing . no accessory muscle use, no dullness to  percussion  CARD:  RRR, no m/r/g  , no peripheral edema, pulses intact, no cyanosis or clubbing.  GI:   Soft & nt; nml bowel sounds; no organomegaly or masses detected.   Musco: Warm bil, no deformities or joint swelling noted.   Neuro: alert, no focal deficits noted.    Skin: Warm, no lesions or rashes  Dasan Hardman NP-C  [facility]  .06/21/2016       Assessment & Plan:

## 2016-06-22 NOTE — Progress Notes (Signed)
Reviewed, agree 

## 2016-08-08 LAB — HM COLONOSCOPY

## 2016-09-02 ENCOUNTER — Telehealth: Payer: Self-pay | Admitting: Pulmonary Disease

## 2016-09-02 MED ORDER — PREDNISONE 10 MG PO TABS: ORAL_TABLET | ORAL | 0 refills | Status: DC

## 2016-09-02 NOTE — Telephone Encounter (Signed)
Spoke with pt. States that he is not feeling well. Reports sinus drainage, sinus congestion and wheezing. Denies cough, chest tightness, SOB or fever. Symptoms started 2-3 days ago. Pt was offered an appointment today at 4:15pm but declined due to having to work. He would like to have something called in.  BQ - please advise. Thanks.

## 2016-09-02 NOTE — Telephone Encounter (Signed)
He needs prednisone taper: Take 40mg  po daily for 3 days, then take 30mg  po daily for 3 days, then take 20mg  po daily for two days, then take 10mg  po daily for 2 days Use albuterol prn for dyspnea Ensure he still has Dulera at home and is using 2 puff bid He needs a follow up with us in the next 4-6 weeks given recurrent exacerbations.  Please confirm this is on the books.

## 2016-09-02 NOTE — Telephone Encounter (Signed)
Pt aware of BQ's recommendations, and voiced his understanding. Rx sent to preferred prahmacy. Pt scheduled for rov on 09-28-16. Nothing further needed.

## 2016-09-08 ENCOUNTER — Other Ambulatory Visit: Payer: Self-pay | Admitting: Adult Health

## 2016-09-13 ENCOUNTER — Telehealth: Payer: Self-pay | Admitting: Pulmonary Disease

## 2016-09-13 NOTE — Telephone Encounter (Signed)
PA initiated through cover my meds. KEY:  DNPRAT NAME:  [name] DOB: 1965-11-16  Pt ID#  [id number]  Will forward to ashley to follow up on.

## 2016-09-13 NOTE — Telephone Encounter (Signed)
Deniedtoday   Request Reference Number: [id number]. DULERA AER [id number] is denied due to Plan Exclusion. For further questions, call [phone]. Appeals are not supported through ePA. Please refer to the written [place] notice for appeals information and instructions.   BQ please advise if you want to go forward with the appeal or prescribe something else.  thanks

## 2016-09-14 NOTE — Telephone Encounter (Signed)
Spoke with pt and made him aware of what is going on with his insurance. Pt will reach out to his insurance company and then follow up with us. Nothing further at this time is needed.

## 2016-09-14 NOTE — Telephone Encounter (Signed)
Check with insurance company to see what respiratory medicines are covered

## 2016-09-15 MED ORDER — FLUTICASONE-SALMETEROL 113-14 MCG/ACT IN AEPB: 1.0000 | INHALATION_SPRAY | Freq: Two times a day (BID) | RESPIRATORY_TRACT | 5 refills | Status: DC

## 2016-09-15 NOTE — Telephone Encounter (Signed)
Air duo, 1 puff medium dose bid

## 2016-09-15 NOTE — Telephone Encounter (Signed)
Spoke with pt, aware of rx change.  rx sent to preferred pharmacy.  Nothing further needed.  

## 2016-09-15 NOTE — Telephone Encounter (Signed)
Pt called to let us know that the covered alternatives are Qvar, Alvesco and AirDuo.  BQ - please advise. Thanks.

## 2016-09-28 ENCOUNTER — Ambulatory Visit (INDEPENDENT_AMBULATORY_CARE_PROVIDER_SITE_OTHER): Payer: 59 | Admitting: Pulmonary Disease

## 2016-09-28 ENCOUNTER — Encounter: Payer: Self-pay | Admitting: Pulmonary Disease

## 2016-09-28 DIAGNOSIS — J4541 Moderate persistent asthma with (acute) exacerbation: Secondary | ICD-10-CM

## 2016-09-28 MED ORDER — ALBUTEROL SULFATE HFA 108 (90 BASE) MCG/ACT IN AERS: INHALATION_SPRAY | RESPIRATORY_TRACT | 5 refills | Status: DC

## 2016-09-28 MED ORDER — PREDNISONE 10 MG PO TABS: ORAL_TABLET | ORAL | 1 refills | Status: DC

## 2016-09-28 NOTE — Patient Instructions (Signed)
Take the prednisone taper as prescribed Keep taking the air duo as you are doing If you have another flare up of your asthma, please call me because I will need to increase the dose of the air duo  We will see you back in 4-6 months or sooner if needed

## 2016-09-28 NOTE — Assessment & Plan Note (Signed)
In the last few months Mike Hayes has had an uptake in exacerbations. In general, he says this is much better than it was years ago, but I'm still concerned about his frequency of exacerbations. Today we spent a significant amount of time reviewing history and environmental exposures and he is not able to identify any recent sick contacts or changes in his environment which may explain his symptoms.  Plan: Prednisone taper He will call me and let me know if he has another flareup in the next few months, if he does then we will increase the dose of his air duo to the maximum dose If he continues to have flareups this year then we will consider a biologic agent Follow-up 4-6 months or sooner if needed

## 2016-09-28 NOTE — Progress Notes (Signed)
Subjective:    Patient ID: Mike Hayes, male    DOB: 1966-03-11, 51 y.o.   MRN: [id number]  Synopsis: Former patient of Dr. Delford Field with moderate persistent asthma that flared up in 2009.  - flare 03/14/14 and 06/06/14 on asthmanex     HPI Chief Complaint  Patient presents with  . Follow-up    c/o increased wheezing, "cold symptoms" X3-4 days.  denies cough, mucus production, sinus congestion.     He needed a prednisone taper a few weeks ago.  This helped.  He is now worse again, increasing wheezing.  No fever, no chills, headache, sinus symptoms.  He is just having more chest tightness.  The symptoms are worse at night with more wheezing.  He says that there have been no major changes in his environment lately.  No tobacco use.    He continues to take Benicar and furosemide, these were adjusted recently.    Past Medical History:  Diagnosis Date  . Childhood asthma   . Diabetes mellitus   . High blood pressure   . Seasonal allergies   . Sinus trouble       Review of Systems  Constitutional: Negative for chills, fatigue and fever.  HENT: Negative for postnasal drip, rhinorrhea and sinus pressure.   Respiratory: Positive for cough, shortness of breath and wheezing. Negative for chest tightness.   Cardiovascular: Negative for chest pain, palpitations and leg swelling.       Objective:   Physical Exam Vitals:   09/28/16 1010  BP: 130/68  Pulse: 91  SpO2: 95%  Weight: 206 lb (93.4 kg)  Height: 5\' 11"  (1.803 m)    Gen: well appearing HENT: OP clear, TM's clear, neck supple PULM: Wheezing bilaterally, normal effort CV: RRR, no mgr, trace edema GI: BS+, soft, nontender Derm: no cyanosis or rash Psyche: normal mood and affect   Labs: >>RAST +++positive , IgE >700, +++eosinophils  08/04/15, high nitric oxide   PFT 09/2015 PFTs > Ratio 78%, FEV1 3.78 L (78%, 5% change), FVC 4.82L (99% pred), TLC 7.0 L (103% pred), DLCO 44.6 (139% pred)     Assessment & Plan:    Moderate persistent asthma In the last few months Mike Hayes has had an uptake in exacerbations. In general, he says this is much better than it was years ago, but I'm still concerned about his frequency of exacerbations. Today we spent a significant amount of time reviewing history and environmental exposures and he is not able to identify any recent sick contacts or changes in his environment which may explain his symptoms.  Plan: Prednisone taper He will call me and let me know if he has another flareup in the next few months, if he does then we will increase the dose of his air duo to the maximum dose If he continues to have flareups this year then we will consider a biologic agent Follow-up 4-6 months or sooner if needed    Current Outpatient Prescriptions:  .  albuterol (VENTOLIN HFA) 108 (90 Base) MCG/ACT inhaler, TAKE 2 PUFFS EVERY 4 TO 6 HOURS AS NEEDED, Disp: 18 g, Rfl: 5 .  cetirizine (ZYRTEC) 10 MG chewable tablet, Chew 10 mg by mouth daily., Disp: , Rfl:  .  Cholecalciferol (VITAMIN D) 1000 UNITS capsule, Take 2,000 mg by mouth daily., Disp: , Rfl:  .  Fluticasone-Salmeterol (AIRDUO RESPICLICK 113/14) 113-14 MCG/ACT AEPB, Inhale 1 puff into the lungs 2 (two) times daily., Disp: 1 each, Rfl: 5 .  furosemide (LASIX)  20 MG tablet, Take 20 mg by mouth daily., Disp: , Rfl:  .  glimepiride (AMARYL) 1 MG tablet, Take 1 tablet by mouth 2 (two) times daily. , Disp: , Rfl:  .  metFORMIN (GLUCOPHAGE) 1000 MG tablet, Take 1,000 mg by mouth 2 (two) times daily., Disp: , Rfl:  .  Multiple Vitamin (MULTIVITAMIN WITH MINERALS) TABS tablet, Take 1 tablet by mouth daily., Disp: , Rfl:  .  olmesartan (BENICAR) 40 MG tablet, Take 40 mg by mouth daily., Disp: , Rfl:  .  terbinafine (LAMISIL) 250 MG tablet, Take 250 mg by mouth as needed., Disp: , Rfl:  .  predniSONE (DELTASONE) 10 MG tablet, Take 40mg  po daily for 3 days, then take 30mg  po daily for 3 days, then take 20mg  po daily for two days, then  take 10mg  po daily for 2 days, Disp: 27 tablet, Rfl: 1

## 2016-11-05 IMAGING — DX DG CHEST 2V
2 series · 2 of 2 positions shown · non-contrast
Comparison: None. Patient's prior chest x-ray from 9227 is not
available for comparison.

CLINICAL DATA: Asthma flare up and shortness of breath with cough
for 12 days

EXAM:
CHEST  2 VIEW

[chest pa]
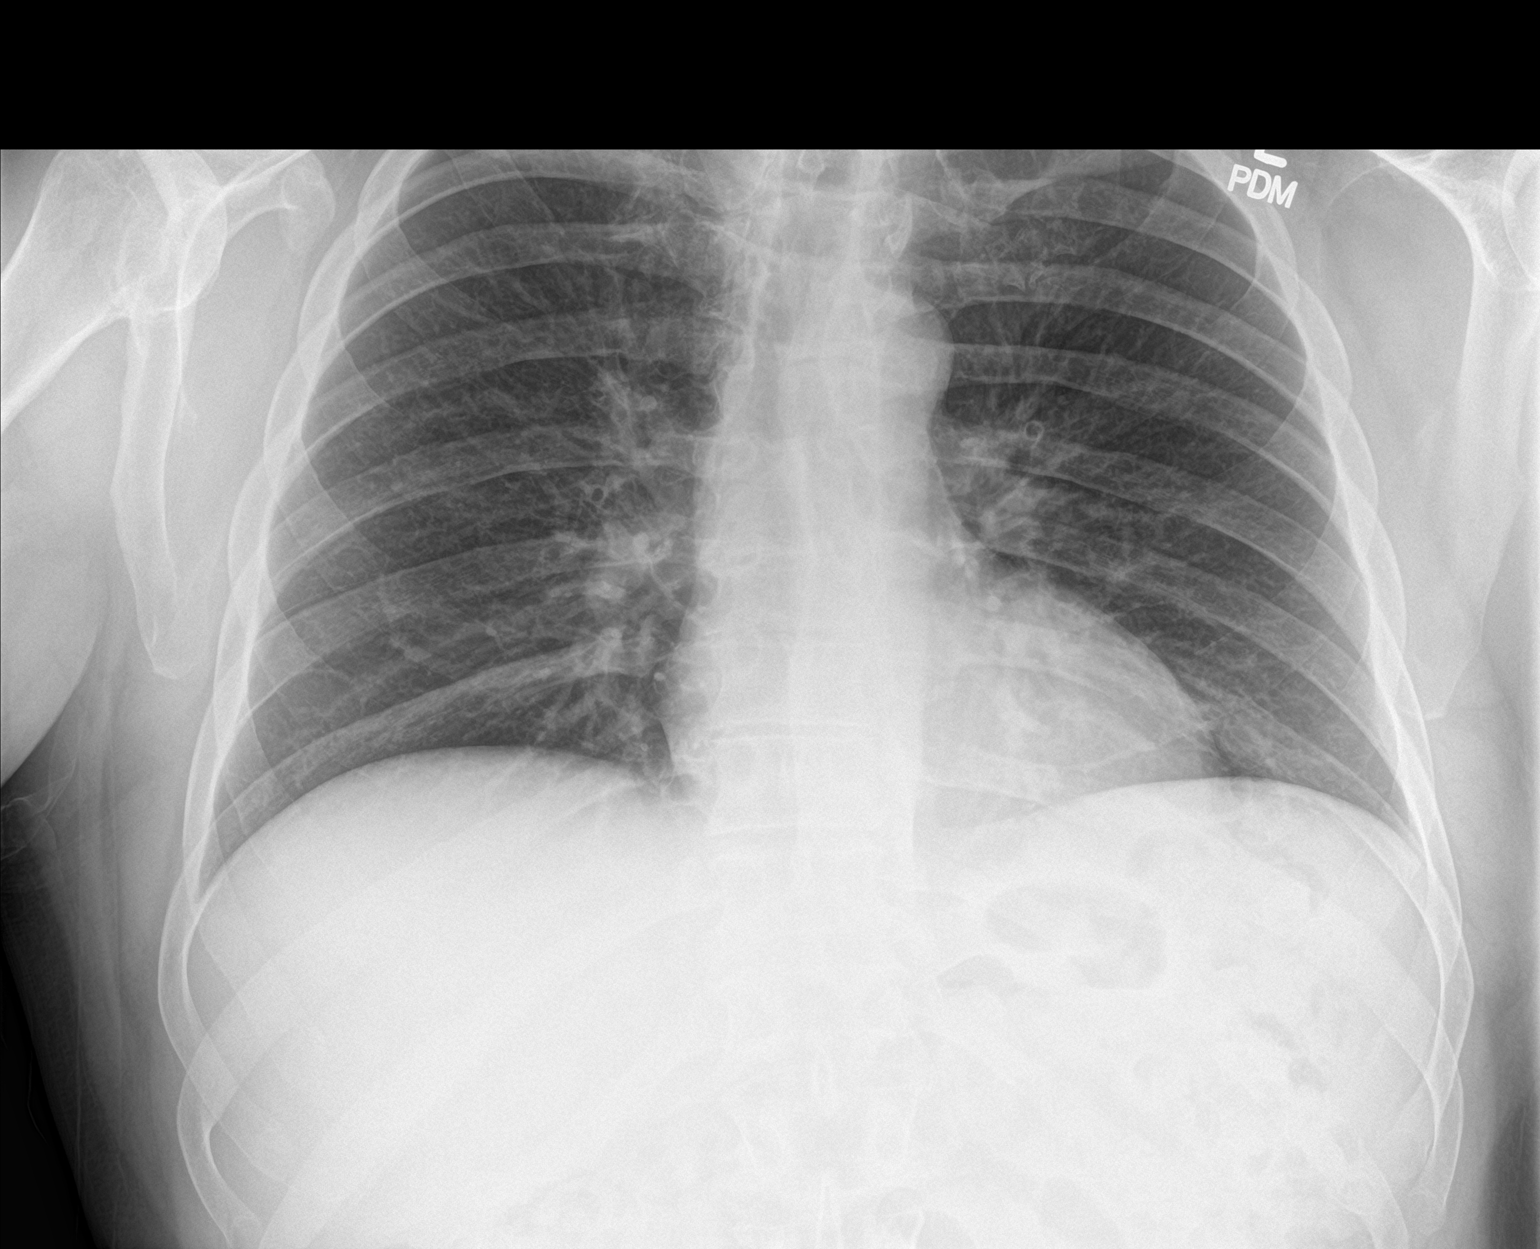

[chest lat]
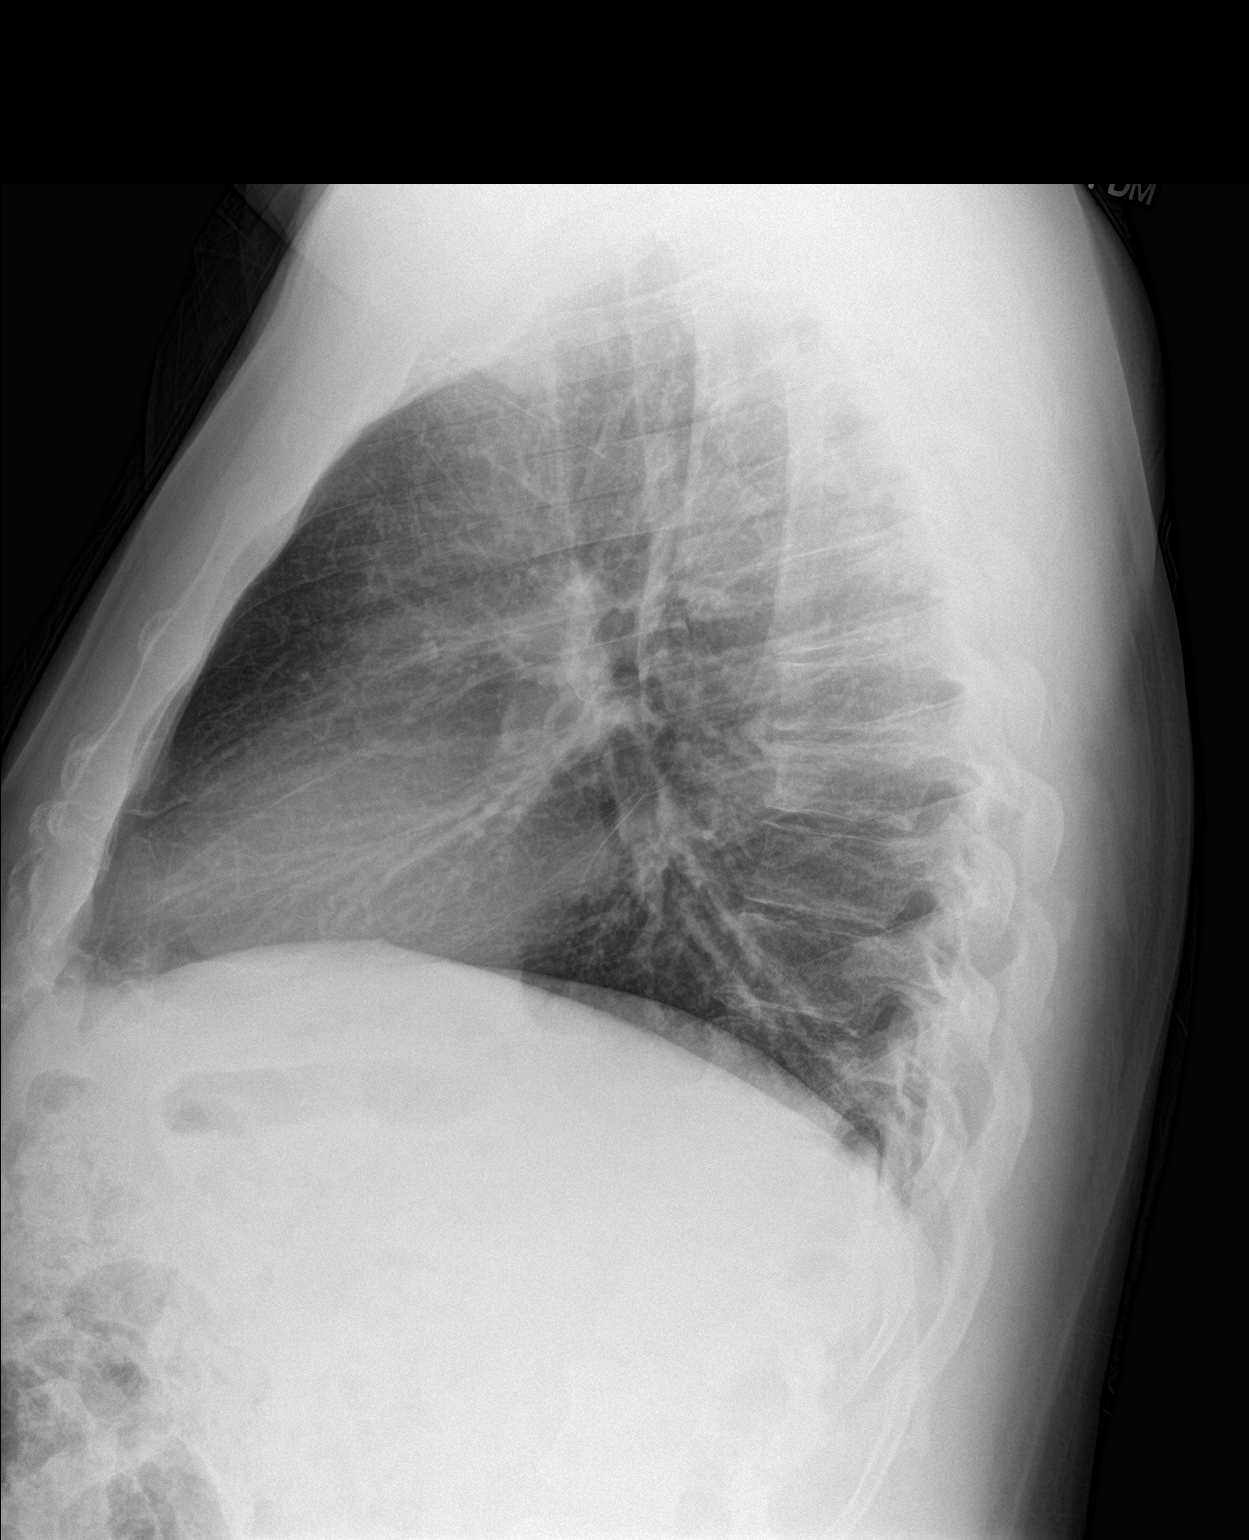

[2 of 2 positions shown; findings below may reference images not displayed]

FINDINGS: The heart size and mediastinal contours are within normal limits.
There is no focal infiltrate, pulmonary edema, or pleural effusion.
The visualized skeletal structures are unremarkable.
IMPRESSION: No active cardiopulmonary disease.

## 2016-11-10 ENCOUNTER — Telehealth: Payer: Self-pay | Admitting: Pulmonary Disease

## 2016-11-10 MED ORDER — PREDNISONE 10 MG PO TABS: ORAL_TABLET | ORAL | 0 refills | Status: DC

## 2016-11-10 MED ORDER — FLUTICASONE-SALMETEROL 232-14 MCG/ACT IN AEPB: 1.0000 | INHALATION_SPRAY | Freq: Two times a day (BID) | RESPIRATORY_TRACT | 0 refills | Status: DC

## 2016-11-10 NOTE — Telephone Encounter (Signed)
Pt aware of CY below message. Rx sent to preferred pharmacy. Nothing further needed.

## 2016-11-10 NOTE — Telephone Encounter (Signed)
Ok to change AirDuo to    232/14     # 1,    Inhale 1 puff, then rinse mouth, twice dialy  Ok to send prednisone 10 mg, # 20,   4 X 2 DAYS, 3 X 2 DAYS, 2 X 2 DAYS, 1 X 2 DAYS

## 2016-11-10 NOTE — Telephone Encounter (Signed)
CY  Please Advise in BQ's absence-Sick Message  Pt called in c/o non productive cough, some wheezing,slight chest tightness, using his rescue inhaler at least once a day, Denies fever. Pt is requesting prednisone. Also he states the last time he saw BQ they discussed increasing the dose of pt's air duo. Pt states he would like to change to higher dose states he does not feel what he has is working.  Allergies  Allergen Reactions  . Exenatide Other (See Comments)    Got knots and rash.   Current Outpatient Prescriptions on File Prior to Visit  Medication Sig Dispense Refill  . albuterol (VENTOLIN HFA) 108 (90 Base) MCG/ACT inhaler TAKE 2 PUFFS EVERY 4 TO 6 HOURS AS NEEDED 18 g 5  . cetirizine (ZYRTEC) 10 MG chewable tablet Chew 10 mg by mouth daily.    . Cholecalciferol (VITAMIN D) 1000 UNITS capsule Take 2,000 mg by mouth daily.    . Fluticasone-Salmeterol (AIRDUO RESPICLICK 113/14) 113-14 MCG/ACT AEPB Inhale 1 puff into the lungs 2 (two) times daily. 1 each 5  . furosemide (LASIX) 20 MG tablet Take 20 mg by mouth daily.    Marland Kitchen glimepiride (AMARYL) 1 MG tablet Take 1 tablet by mouth 2 (two) times daily.     . metFORMIN (GLUCOPHAGE) 1000 MG tablet Take 1,000 mg by mouth 2 (two) times daily.    . Multiple Vitamin (MULTIVITAMIN WITH MINERALS) TABS tablet Take 1 tablet by mouth daily.    Marland Kitchen olmesartan (BENICAR) 40 MG tablet Take 40 mg by mouth daily.    . predniSONE (DELTASONE) 10 MG tablet Take  po daily for 3 days, then take  po daily for 3 days, then take  po daily for two days, then take  po daily for 2 days 27 tablet 1  . terbinafine (LAMISIL) 250 MG tablet Take 250 mg by mouth as needed.     No current facility-administered medications on file prior to visit.

## 2016-12-13 ENCOUNTER — Telehealth: Payer: Self-pay | Admitting: Pulmonary Disease

## 2016-12-13 MED ORDER — PREDNISONE 20 MG PO TABS: 20.0000 mg | ORAL_TABLET | Freq: Every day | ORAL | 0 refills | Status: DC

## 2016-12-13 NOTE — Telephone Encounter (Signed)
Spoke with pt, aware of recs.  rx sent to preferred pharmacy.  Nothing further needed.  

## 2016-12-13 NOTE — Telephone Encounter (Signed)
Prednisone 20mg daily x 5 days

## 2016-12-13 NOTE — Telephone Encounter (Signed)
Pt reports of non prod cough & wheezing x3-4d Pt is requesting Rx for prednisone, as he will be going on vacation in 2 weeks. Pt not currently taking any OTC medication to help with symptoms.  BQ please advise. Thanks

## 2016-12-15 ENCOUNTER — Other Ambulatory Visit: Payer: Self-pay | Admitting: Internal Medicine

## 2017-01-09 ENCOUNTER — Telehealth: Payer: Self-pay | Admitting: Pulmonary Disease

## 2017-01-09 MED ORDER — PREDNISONE 20 MG PO TABS: 20.0000 mg | ORAL_TABLET | Freq: Every day | ORAL | 0 refills | Status: DC

## 2017-01-09 NOTE — Telephone Encounter (Signed)
Spoke with, who reports of prod cough with yellow mucus, wheezing,snezzing, nasal drainage clear in color and increased sob x3d. Denies fever, chills or sweats. Pt currently [place] at [place] and [facility] [address], [place], [place] [id number].  BQ please advise. Thanks.

## 2017-01-09 NOTE — Telephone Encounter (Signed)
Prednisone 20mg daily x 5 days

## 2017-01-09 NOTE — Telephone Encounter (Signed)
Spoke with the pt and notified of recs per BQ  He verbalized understanding  Rx was sent 

## 2017-01-23 ENCOUNTER — Ambulatory Visit: Payer: 59 | Admitting: Acute Care

## 2017-02-12 ENCOUNTER — Other Ambulatory Visit: Payer: Self-pay | Admitting: Internal Medicine

## 2017-02-13 NOTE — Telephone Encounter (Signed)
Will forward to [place] as this is a BQ patient.

## 2017-04-27 DIAGNOSIS — R6882 Decreased libido: Secondary | ICD-10-CM | POA: Insufficient documentation

## 2017-07-05 ENCOUNTER — Telehealth: Payer: Self-pay | Admitting: Pulmonary Disease

## 2017-07-05 MED ORDER — PREDNISONE 10 MG PO TABS: ORAL_TABLET | ORAL | 0 refills | Status: DC

## 2017-07-05 NOTE — Telephone Encounter (Signed)
Pt c/o sinus congestion, pnd, stuffy nose, nonprod cough X2-3 days.  Denies fever, chest pain, mucus production.   Pt has been taking otc cold and cough meds to help with s/s, requesting additional recs.    Pt uses [facility] in [place].    BQ please advise on additional recs.  Thanks.

## 2017-07-05 NOTE — Telephone Encounter (Signed)
He needs to see us again In the meantime: prednisone: Take 40mg  po daily for 3 days, then take 30mg  po daily for 3 days, then take 20mg  po daily for two days, then take 10mg  po daily for 2 days

## 2017-07-05 NOTE — Telephone Encounter (Signed)
Called spoke with patient Discussed BQ's recommendations - pt okay with the pred taper Appt scheduled with SG on next available 1.2.18 @ 1445  Rx sent to verified pharmacy Nothing further needed; will sign off

## 2017-07-19 ENCOUNTER — Encounter: Payer: Self-pay | Admitting: Acute Care

## 2017-07-19 ENCOUNTER — Ambulatory Visit: Payer: 59 | Admitting: Acute Care

## 2017-07-19 VITALS — BP 130/70 | HR 81 | Ht 70.0 in | Wt 210.0 lb

## 2017-07-19 DIAGNOSIS — R05 Cough: Secondary | ICD-10-CM | POA: Diagnosis not present

## 2017-07-19 DIAGNOSIS — R059 Cough, unspecified: Secondary | ICD-10-CM

## 2017-07-19 DIAGNOSIS — J4540 Moderate persistent asthma, uncomplicated: Secondary | ICD-10-CM

## 2017-07-19 LAB — NITRIC OXIDE: Nitric Oxide: 36

## 2017-07-19 NOTE — Patient Instructions (Signed)
It is good to see you today. I am glad you are feeling better. FENO today Continue Asthmanex daily Rescue inhaler as needed for shortness of breath Renew Proventil if needed Renew asthmanex if needed Follow up with McQuaid in  6 months Please contact office for sooner follow up if symptoms do not improve or worsen or seek emergency care

## 2017-07-19 NOTE — Progress Notes (Signed)
History of Present Illness Mike Hayes is a 52 y.o. male never smoker with moderate persistent asthma. He is followed by Dr. Kendrick Fries  Synopsis: Former patient of Dr. Delford Field with moderate persistent asthma that flared up in 2009.  - flare 03/14/14 and 06/06/14 on asthmanex  Also takes Benicar and Furosemide.   07/19/2017 Follow Up after prednisone prescribed by phone for worsening cough.  Pt was prescribed prednisone by phone for an exacerbation of his asthma on 07/05/2017. He did not have a cold prior to symptoms. He just had wheeze and cough for 4-5 days prior to calling the office. He does not know what trigger initiated the exacerbation.He  States cough has resolved. He states wheezing has resolved. He feels he is back to his baseline.He is compliant with his asthmanex.He denies fever, chest pain, orthopnea or hemoptysis.  Test Results: FENO 07/19/2017>> 36 Labs: >>RAST +++positive , IgE >700, +++eosinophils  08/04/15, high nitric oxide   PFT 09/2015 PFTs > Ratio 78%, FEV1 3.78 L (78%, 5% change), FVC 4.82L (99% pred), TLC 7.0 L (103% pred), DLCO 44.6 (139% pred)   CBC Latest Ref Rng & Units 08/03/2015  WBC 4.0 - 10.5 K/uL 9.4  Hemoglobin 13.0 - 17.0 g/dL 16.1  Hematocrit 09.6 - 52.0 % 48.7  Platelets 150.0 - 400.0 K/uL 138.0(L)    PFT    Component Value Date/Time   FEV1PRE 3.59 09/24/2015 1430   FEV1POST 3.78 09/24/2015 1430   FVCPRE 4.68 09/24/2015 1430   FVCPOST 4.82 09/24/2015 1430   TLC 7.10 09/24/2015 1430   DLCOUNC 44.27 09/24/2015 1430   PREFEV1FVCRT 77 09/24/2015 1430   PSTFEV1FVCRT 78 09/24/2015 1430    No results found.   Past medical hx Past Medical History:  Diagnosis Date  . Childhood asthma   . Diabetes mellitus   . High blood pressure   . Seasonal allergies   . Sinus trouble      Social History   Tobacco Use  . Smoking status: Never Smoker  . Smokeless tobacco: Never Used  Substance Use Topics  . Alcohol use: Yes    Alcohol/week: 0.0 oz      Comment: socially  . Drug use: No    Mr.Spenser reports that  has never smoked. he has never used smokeless tobacco. He reports that he drinks alcohol. He reports that he does not use drugs.  Tobacco Cessation: Never smoker  Past surgical hx, Family hx, Social hx all reviewed.  Current Outpatient Medications on File Prior to Visit  Medication Sig  . albuterol (VENTOLIN HFA) 108 (90 Base) MCG/ACT inhaler TAKE 2 PUFFS EVERY 4 TO 6 HOURS AS NEEDED  . cetirizine (ZYRTEC) 10 MG chewable tablet Chew 10 mg by mouth daily.  . Cholecalciferol (VITAMIN D) 1000 UNITS capsule Take 2,000 mg by mouth daily.  . Fluticasone-Salmeterol 232-14 MCG/ACT AEPB INHALE 1 PUFF TWICE DAILY  . furosemide (LASIX) 20 MG tablet Take 20 mg by mouth daily.  Marland Kitchen glimepiride (AMARYL) 1 MG tablet Take 1 tablet by mouth 2 (two) times daily.   . metFORMIN (GLUCOPHAGE) 1000 MG tablet Take 1,000 mg by mouth 2 (two) times daily.  . Multiple Vitamin (MULTIVITAMIN WITH MINERALS) TABS tablet Take 1 tablet by mouth daily.  Marland Kitchen olmesartan (BENICAR) 40 MG tablet Take 40 mg by mouth daily.  Marland Kitchen terbinafine (LAMISIL) 250 MG tablet Take 250 mg by mouth as needed.   No current facility-administered medications on file prior to visit.      Allergies  Allergen  Reactions  . Exenatide Other (See Comments)    Got knots and rash.    Review Of Systems:  Constitutional:   No  weight loss, night sweats,  Fevers, chills, fatigue, or  lassitude.  HEENT:   No headaches,  Difficulty swallowing,  Tooth/dental problems, or  Sore throat,                No sneezing, itching, ear ache, nasal congestion, post nasal drip,   CV:  No chest pain,  Orthopnea, PND, swelling in lower extremities, anasarca, dizziness, palpitations, syncope.   GI  No heartburn, indigestion, abdominal pain, nausea, vomiting, diarrhea, change in bowel habits, loss of appetite, bloody stools.   Resp: Resolved +  shortness of breath with exertion less at rest.  No excess  mucus, Resolved +  productive cough,  No non-productive cough,  No coughing up of blood.  No change in color of mucus.  Resolved +  wheezing.  No chest wall deformity  Skin: no rash or lesions.  GU: no dysuria, change in color of urine, no urgency or frequency.  No flank pain, no hematuria   MS:  No joint pain or swelling.  No decreased range of motion.  No back pain.  Psych:  No change in mood or affect. No depression or anxiety.  No memory loss.   Vital Signs BP 130/70 (BP Location: Right Arm, Cuff Size: Normal)   Pulse 81   Ht 5\' 10"  (1.778 m)   Wt 210 lb (95.3 kg)   SpO2 95%   BMI 30.13 kg/m    Physical Exam:  General- No distress,  A&Ox3 pleasant ENT: No sinus tenderness, TM clear, pale nasal mucosa, no oral exudate,no post nasal drip, no LAN Cardiac: S1, S2, regular rate and rhythm, no murmur Chest: Few Exp.  Wheeze/No  rales/ dullness; no accessory muscle use, no nasal flaring, no sternal retractions Abd.: Soft Non-tender, non-distended, Ext: No clubbing cyanosis, edema Neuro:  normal strength Skin: No rashes, warm and dry Psych: normal mood and behavior   Assessment/Plan  No problem-specific Assessment & Plan notes found for this encounter.    Bevelyn NgoSarah F Alicen Donalson, NP 07/19/2017  3:03 PM

## 2017-07-20 ENCOUNTER — Encounter: Payer: Self-pay | Admitting: Acute Care

## 2017-07-20 NOTE — Assessment & Plan Note (Signed)
Minor Flare Back to baseline after treatment with pred taper starting 12/19 per Dr. Kendrick FriesMcQuaid FENO is 36 ppb 07/19/2017, but patient has deferred on any further prednisone treatment Plan: FENO today Continue Asthmanex daily Rescue inhaler as needed for shortness of breath Renew Proventil if needed Renew asthmanex if needed Follow up with McQuaid in  6 months Please contact office for sooner follow up if symptoms do not improve or worsen or seek emergency care

## 2017-08-01 ENCOUNTER — Telehealth: Payer: Self-pay | Admitting: Pulmonary Disease

## 2017-08-01 MED ORDER — PREDNISONE 10 MG PO TABS: ORAL_TABLET | ORAL | 0 refills | Status: DC

## 2017-08-01 NOTE — Telephone Encounter (Signed)
If he thinks that another predisone taper will help we can call that in: Take 40mg  po daily for 3 days, then take 30mg  po daily for 3 days, then take 20mg  po daily for two days, then take 10mg  po daily for 2 days  That being said I think he may need something stronger than the Asmanex.  He will need to be seen to assess for that.  Rec OV.

## 2017-08-01 NOTE — Telephone Encounter (Signed)
Called pt to let him know we could send another Rx of prednisone to his preferred pharmacy if that is what he thinks he needs. Also told pt that we might need him to come in for an OV to reassess to see if he needs him something else other than the Asmanex.  Pt stated to me that he would like us to send in another round of prednisone to his pharmacy but since he was in here recently, he does not want to come in for an ov at this time.  I stated to pt that after this next round of prednisone, if he is still having problems for him to call our office and at that point, we might need him to come in for an ov.  Pt expressed understanding. Nothing further needed.

## 2017-08-01 NOTE — Telephone Encounter (Signed)
Spoke with pt. States that he is not feeling better. Reports dry cough and wheezing. Denies chest tightness, SOB, fever or body aches. He saw SG on 07/19/17 and has not improved since then. Pt was given a prednisone taper and has since finished that but only received minimal relief. He would like BQ's recommendations.  BQ - please advise. Thanks.

## 2017-09-14 DIAGNOSIS — B351 Tinea unguium: Secondary | ICD-10-CM | POA: Insufficient documentation

## 2017-09-27 ENCOUNTER — Telehealth: Payer: Self-pay | Admitting: Pulmonary Disease

## 2017-09-27 NOTE — Telephone Encounter (Signed)
Spoke with patient. He has been dealing with a sinus infection for the past 3-4 days. Facial pressure around his nose and eyes, runny nose with yellow discharge and increased wheezing. Denied any body aches or fever. Has had a slight headache since yesterday morning.   He wishes to have an abx called in for him.   He wishes to use [facility] in [place] [address]   BQ, please advise! Thanks!

## 2017-09-27 NOTE — Telephone Encounter (Signed)
Doxycycline 100mg po bid x 5 days 

## 2017-09-27 NOTE — Telephone Encounter (Signed)
Left message for patient to call back  

## 2017-09-28 MED ORDER — DOXYCYCLINE HYCLATE 100 MG PO TABS
100.0000 mg | ORAL_TABLET | Freq: Two times a day (BID) | ORAL | 0 refills | Status: DC
Start: 2017-09-28 — End: 2017-10-17

## 2017-09-28 NOTE — Telephone Encounter (Signed)
Spoke with patient. He is aware of BQ's recs. Will go ahead and call in medication for him. Nothing else needed at time of call.

## 2017-09-30 ENCOUNTER — Other Ambulatory Visit: Payer: Self-pay | Admitting: Pulmonary Disease

## 2017-10-01 DIAGNOSIS — E1169 Type 2 diabetes mellitus with other specified complication: Secondary | ICD-10-CM | POA: Insufficient documentation

## 2017-10-05 ENCOUNTER — Telehealth: Payer: Self-pay | Admitting: Pulmonary Disease

## 2017-10-05 MED ORDER — BENZONATATE 200 MG PO CAPS: 200.0000 mg | ORAL_CAPSULE | Freq: Three times a day (TID) | ORAL | 1 refills | Status: DC

## 2017-10-05 MED ORDER — FLUTICASONE-SALMETEROL 250-50 MCG/DOSE IN AEPB: 1.0000 | INHALATION_SPRAY | Freq: Two times a day (BID) | RESPIRATORY_TRACT | 5 refills | Status: DC

## 2017-10-05 NOTE — Telephone Encounter (Signed)
Spoke with the pt  He states needing alternative for generic advair 232-14  This is currently on back order and he has 2 days remaining  BQ on vacation so need doc of day to advise alternative He also is requesting something for a non prod cough that he states just started 2 days ago  He was recently txed with Doxy for sinus infection  CDY- please advise thanks!

## 2017-10-05 NOTE — Telephone Encounter (Signed)
If the sinus infection is resolved, offer benzonatate perles 20 mg, # 30, 1 every 8 hours as needed for cough, ref x 1  Instead of the Advair HFA, can he take Advair discus 250 mg, 1 inhalation then rinse mouth,m twice daily, ref x 5 ??

## 2017-10-05 NOTE — Telephone Encounter (Signed)
Called pt and advised message from the provider. Pt understood and verbalized understanding. Nothing further is needed.   Rx's sent in.  

## 2017-10-17 ENCOUNTER — Encounter: Payer: Self-pay | Admitting: Adult Health

## 2017-10-17 ENCOUNTER — Ambulatory Visit (INDEPENDENT_AMBULATORY_CARE_PROVIDER_SITE_OTHER)
Admission: RE | Admit: 2017-10-17 | Discharge: 2017-10-17 | Disposition: A | Discharge status: Home / Self Care | Payer: 59 | Source: Ambulatory Visit | Attending: Adult Health | Admitting: Adult Health

## 2017-10-17 ENCOUNTER — Ambulatory Visit: Payer: 59 | Admitting: Adult Health

## 2017-10-17 ENCOUNTER — Other Ambulatory Visit (INDEPENDENT_AMBULATORY_CARE_PROVIDER_SITE_OTHER): Payer: 59

## 2017-10-17 VITALS — BP 128/80 | HR 74 | Temp 97.9°F | Ht 70.0 in | Wt 210.0 lb

## 2017-10-17 DIAGNOSIS — J301 Allergic rhinitis due to pollen: Secondary | ICD-10-CM | POA: Diagnosis not present

## 2017-10-17 DIAGNOSIS — J4541 Moderate persistent asthma with (acute) exacerbation: Secondary | ICD-10-CM | POA: Diagnosis not present

## 2017-10-17 LAB — CBC WITH DIFFERENTIAL/PLATELET
BASOS PCT: 0.8 % (ref 0.0–3.0)
Basophils Absolute: 0.1 10*3/uL (ref 0.0–0.1)
EOS ABS: 0.8 10*3/uL — AB (ref 0.0–0.7)
EOS PCT: 12.3 % — AB (ref 0.0–5.0)
HCT: 47.5 % (ref 39.0–52.0)
Hemoglobin: 16.3 g/dL (ref 13.0–17.0)
LYMPHS ABS: 2.1 10*3/uL (ref 0.7–4.0)
Lymphocytes Relative: 30.3 % (ref 12.0–46.0)
MCHC: 34.2 g/dL (ref 30.0–36.0)
MCV: 84.3 fl (ref 78.0–100.0)
MONO ABS: 0.5 10*3/uL (ref 0.1–1.0)
Monocytes Relative: 7.7 % (ref 3.0–12.0)
NEUTROS PCT: 48.9 % (ref 43.0–77.0)
Neutro Abs: 3.4 10*3/uL (ref 1.4–7.7)
Platelets: 133 10*3/uL — ABNORMAL LOW (ref 150.0–400.0)
RBC: 5.64 Mil/uL (ref 4.22–5.81)
RDW: 13.5 % (ref 11.5–15.5)
WBC: 6.9 10*3/uL (ref 4.0–10.5)

## 2017-10-17 MED ORDER — PREDNISONE 10 MG PO TABS: ORAL_TABLET | ORAL | 0 refills | Status: DC

## 2017-10-17 NOTE — Progress Notes (Signed)
 @Patient  ID: Mike Hayes, male    DOB: 1965/10/08, 52 y.o.   MRN: [id number]  Chief Complaint  Patient presents with  . Acute Visit    Cough     Referring provider: Andreas Blowerabeza, Yuri M., MD  HPI: 52 year old male never smoker followed for moderate persistent asthma with severe atopy PMH Obesity s/p gastric bypass, DM   TEST /Events  RAST + , IgE 261 PFT today shows normal lung function with FEV1 97%, ratio 78, FVC 96%, DLCO 139%.  CXR last ov with no acute 2017 Previous RAST testing very positive w/ elevated IgE. ,CBC shows very high eosinophils .  FENO testing very high at 255. Repeat FENO is much lower at 39 .    10/17/2017 Acute OV : Asthma  Patient presents for an acute office visit.  Complains that over the last 2-3 weeks that he has had increased cough wheezing and sinus drainage.  He is changed from Zyrtec to Sudafed.  Has not helped his symptoms very much. Patient is also had a change from Asmanex to Advair due to back order status at pharmacy. He finished his last dose of Asmanex today and will begin Advair tomorrow/ Over the last year he has had several exacerbations of asthma requiring prednisone. Previous testing has showed significantly high IgE and eosinophils in the past.  She has underlying diabetes and steroids to make his blood sugars increased.    Allergies  Allergen Reactions  . Exenatide Other (See Comments)    Got knots and rash.    There is no immunization history for the selected administration types on file for this patient.  Past Medical History:  Diagnosis Date  . Childhood asthma   . Diabetes mellitus   . High blood pressure   . Seasonal allergies   . Sinus trouble     Tobacco History: Social History   Tobacco Use  Smoking Status Never Smoker  Smokeless Tobacco Never Used   Counseling given: Not Answered   Outpatient Encounter Medications as of 10/17/2017  Medication Sig  . albuterol (PROVENTIL HFA;VENTOLIN HFA) 108 (90 Base)  MCG/ACT inhaler INHALE 2 PUFFS BY MOUTH EVERY 4 TO 6 HOURS AS NEEDED  . benzonatate (TESSALON) 200 MG capsule Take 1 capsule (200 mg total) by mouth every 8 (eight) hours.  . Cholecalciferol (VITAMIN D) 1000 UNITS capsule Take 2,000 mg by mouth daily.  . empagliflozin (JARDIANCE) 10 MG TABS tablet Take 10 mg by mouth daily.  . Fluticasone-Salmeterol (ADVAIR) 250-50 MCG/DOSE AEPB Inhale 1 puff into the lungs every 12 (twelve) hours.  Marland Kitchen. glimepiride (AMARYL) 1 MG tablet Take 1 tablet by mouth 2 (two) times daily.   Marland Kitchen. losartan (COZAAR) 100 MG tablet Take 100 mg by mouth daily.  . metFORMIN (GLUCOPHAGE) 1000 MG tablet Take 1,000 mg by mouth 2 (two) times daily.  . Multiple Vitamin (MULTIVITAMIN WITH MINERALS) TABS tablet Take 1 tablet by mouth daily.  . cetirizine (ZYRTEC) 10 MG chewable tablet Chew 10 mg by mouth daily.  . predniSONE (DELTASONE) 10 MG tablet 4 tabs for 2 days, then 3 tabs for 2 days, 2 tabs for 2 days, then 1 tab for 2 days, then stop  . terbinafine (LAMISIL) 250 MG tablet Take 250 mg by mouth as needed.  . [DISCONTINUED] doxycycline (VIBRA-TABS) 100 MG tablet Take 1 tablet (100 mg total) by mouth 2 (two) times daily. (Patient not taking: Reported on 10/17/2017)  . [DISCONTINUED] furosemide (LASIX) 20 MG tablet Take 20 mg by mouth  daily.  . [DISCONTINUED] olmesartan (BENICAR) 40 MG tablet Take 40 mg by mouth daily.  . [DISCONTINUED] predniSONE (DELTASONE) 10 MG tablet Take 4x3days,3x3days,2x2days,1x2days,then stop (Patient not taking: Reported on 10/17/2017)   No facility-administered encounter medications on file as of 10/17/2017.      Review of Systems  Constitutional:   No  weight loss, night sweats,  Fevers, chills,  +fatigue, or  lassitude.  HEENT:   No headaches,  Difficulty swallowing,  Tooth/dental problems, or  Sore throat,                No sneezing, itching, ear ache, +nasal congestion, post nasal drip,   CV:  No chest pain,  Orthopnea, PND, swelling in lower  extremities, anasarca, dizziness, palpitations, syncope.   GI  No heartburn, indigestion, abdominal pain, nausea, vomiting, diarrhea, change in bowel habits, loss of appetite, bloody stools.   Resp: .  No chest wall deformity  Skin: no rash or lesions.  GU: no dysuria, change in color of urine, no urgency or frequency.  No flank pain, no hematuria   MS:  No joint pain or swelling.  No decreased range of motion.  No back pain.    Physical Exam  BP 128/80 (BP Location: Left Arm, Cuff Size: Normal)   Pulse 74   Temp 97.9 F (36.6 C) (Oral)   Ht 5\' 10"  (1.778 m)   Wt 210 lb (95.3 kg)   SpO2 97%   BMI 30.13 kg/m   GEN: A/Ox3; pleasant , NAD,    HEENT:  [place]/AT,  EACs-clear, TMs-wnl, NOSE-clear, THROAT-clear, no lesions, no postnasal drip or exudate noted.   NECK:  Supple w/ fair ROM; no JVD; normal carotid impulses w/o bruits; no thyromegaly or nodules palpated; no lymphadenopathy.    RESP few trace wheezes on forced expiration, speaks in full sentences . no accessory muscle use, no dullness to percussion  CARD:  RRR, no m/r/g, no peripheral edema, pulses intact, no cyanosis or clubbing.  GI:   Soft & nt; nml bowel sounds; no organomegaly or masses detected.   Musco: Warm bil, no deformities or joint swelling noted.   Neuro: alert, no focal deficits noted.    Skin: Warm, no lesions or rashes    Lab Results:  CBC    Component Value Date/Time   WBC 9.4 08/03/2015 1703   RBC 5.67 08/03/2015 1703   HGB 16.0 08/03/2015 1703   HCT 48.7 08/03/2015 1703   PLT 138.0 (L) 08/03/2015 1703   MCV 85.9 08/03/2015 1703   MCHC 32.9 08/03/2015 1703   RDW 13.1 08/03/2015 1703   LYMPHSABS 2.8 08/03/2015 1703   MONOABS 0.5 08/03/2015 1703   EOSABS 1.9 (H) 08/03/2015 1703   BASOSABS 0.0 08/03/2015 1703    BMET No results found for: NA, K, CL, CO2, GLUCOSE, BUN, CREATININE, CALCIUM, GFRNONAA, GFRAA  BNP No results found for: BNP  ProBNP No results found for:  PROBNP  Imaging: Dg Chest 2 View  Result Date: 10/17/2017 CLINICAL DATA:  Shortness of breath and wheezing.  Cough. EXAM: CHEST - 2 VIEW COMPARISON:  August 03, 2015 FINDINGS: Lungs are clear. The heart size and pulmonary vascularity are normal. No adenopathy. There is mild anterior wedging of two lower thoracic vertebral bodies, stable. IMPRESSION: No edema or consolidation. Electronically Signed   By: Bretta Bang III M.D.   On: 10/17/2017 14:48     Assessment & Plan:   Moderate persistent asthma Acute exacerbation with tendency towards recurrent flares.  Will maximize  his current regimen including Advair.  Restarting antihistamine.  We will add in nasal steroid. Recheck labs including IgE Rast panel and CBC with differential If these values remain elevated will consider patient for biologic injection  Plan  Patient Instructions  Prednisone taper over next week.  Restart Zyrtec 10mg  At bedtime   Begin Flonase 2 puffs daily  Labs today  Chest xray today .  Begin Advair 1 puff Twice daily  , rinse after use.  Follow up with Dr. Kendrick Fries in 2 months and As needed   Please contact office for sooner follow up if symptoms do not improve or worsen or seek emergency care       Allergic rhinitis Flare  Plan  Patient Instructions  Prednisone taper over next week.  Restart Zyrtec 10mg  At bedtime   Begin Flonase 2 puffs daily  Labs today  Chest xray today .  Begin Advair 1 puff Twice daily  , rinse after use.  Follow up with Dr. Kendrick Fries in 2 months and As needed   Please contact office for sooner follow up if symptoms do not improve or worsen or seek emergency care          Rubye Oaks, NP 10/17/2017

## 2017-10-17 NOTE — Assessment & Plan Note (Signed)
Acute exacerbation with tendency towards recurrent flares.  Will maximize his current regimen including Advair.  Restarting antihistamine.  We will add in nasal steroid. Recheck labs including IgE Rast panel and CBC with differential If these values remain elevated will consider patient for biologic injection  Plan  Patient Instructions  Prednisone taper over next week.  Restart Zyrtec 10mg  At bedtime   Begin Flonase 2 puffs daily  Labs today  Chest xray today .  Begin Advair 1 puff Twice daily  , rinse after use.  Follow up with Dr. Kendrick FriesMcQuaid in 2 months and As needed   Please contact office for sooner follow up if symptoms do not improve or worsen or seek emergency care

## 2017-10-17 NOTE — Assessment & Plan Note (Signed)
Flare  Plan  Patient Instructions  Prednisone taper over next week.  Restart Zyrtec 10mg  At bedtime   Begin Flonase 2 puffs daily  Labs today  Chest xray today .  Begin Advair 1 puff Twice daily  , rinse after use.  Follow up with Dr. Kendrick FriesMcQuaid in 2 months and As needed   Please contact office for sooner follow up if symptoms do not improve or worsen or seek emergency care

## 2017-10-17 NOTE — Progress Notes (Signed)
Reviewed, agree 

## 2017-10-17 NOTE — Progress Notes (Signed)
Called spoke with patient, advised of cxr results / recs as stated by TP.  Pt verbalized his understanding and denied any questions. 

## 2017-10-17 NOTE — Patient Instructions (Signed)
Prednisone taper over next week.  Restart Zyrtec 10mg  At bedtime   Begin Flonase 2 puffs daily  Labs today  Chest xray today .  Begin Advair 1 puff Twice daily  , rinse after use.  Follow up with Dr. Kendrick FriesMcQuaid in 2 months and As needed   Please contact office for sooner follow up if symptoms do not improve or worsen or seek emergency care

## 2017-10-18 LAB — RESPIRATORY ALLERGY PROFILE REGION II ~~LOC~~
ALLERGEN, COTTONWOOD, T14: 0.14 kU/L — AB
ALLERGEN, OAK, T7: 0.12 kU/L — AB
Allergen, A. alternata, m6: 0.13 kU/L — ABNORMAL HIGH
Allergen, Cedar tree, t12: 0.77 kU/L — ABNORMAL HIGH
Allergen, Comm Silver Birch, t9: <0.1 kU/L
Allergen, D pternoyssinus,d7: 0.26 kU/L — ABNORMAL HIGH
Allergen, Mulberry, t76: <0.1 kU/L
Allergen, P. notatum, m1: 8.85 kU/L — ABNORMAL HIGH
Aspergillus fumigatus, m3: 0.24 kU/L — ABNORMAL HIGH
BERMUDA GRASS: 0.78 kU/L — AB
Box Elder IgE: 0.11 kU/L — ABNORMAL HIGH
CLADOSPORIUM HERBARUM (M2) IGE: 0.23 kU/L — AB
CLASS: 0
CLASS: 0
CLASS: 0
CLASS: 0
CLASS: 0
CLASS: 2
CLASS: 2
CLASS: 2
COCKROACH: 0.22 kU/L — AB
COMMON RAGWEED (SHORT) (W1) IGE: 1.66 kU/L — AB
Cat Dander: <0.1 kU/L
Class: 0
Class: 0
Class: 0
Class: 0
Class: 0
Class: 0
Class: 0
Class: 0
Class: 0
Class: 0
Class: 0
Class: 0
Class: 2
Class: 2
Class: 3
Class: 3
D. farinae: 6.73 kU/L — ABNORMAL HIGH
DOG DANDER: 0.12 kU/L — AB
Elm IgE: 0.12 kU/L — ABNORMAL HIGH
IgE (Immunoglobulin E), Serum: 629 kU/L — ABNORMAL HIGH (ref <=114)
Johnson Grass: 1.09 kU/L — ABNORMAL HIGH
Pecan/Hickory Tree IgE: 0.1 kU/L — ABNORMAL HIGH
Rough Pigweed  IgE: 0.11 kU/L — ABNORMAL HIGH
SHEEP SORREL IGE: 0.13 kU/L — AB
TIMOTHY GRASS: 2.78 kU/L — AB

## 2017-10-18 LAB — INTERPRETATION:

## 2017-11-02 ENCOUNTER — Telehealth: Payer: Self-pay

## 2017-11-02 NOTE — Telephone Encounter (Signed)
Patient is aware of lab results. Patient is wanting more information on the [name] injections before he starts this. Per Lennon AlstromJJ note will route this to [employer]my Scott so that she can call the patient and give him the information he is needing.   TS please advise, thank you.

## 2017-11-02 NOTE — Telephone Encounter (Signed)
-----   Message from Mike Leashouglas B McQuaid, MD sent at 10/26/2017  3:17 PM EDT ----- agree ----- Message ----- From: Julio SicksParrett, Tammy S, NP Sent: 10/20/2017   3:43 PM To: Lowell BoutonJessica E Jones, CMA, Mike Leashouglas B McQuaid, MD  Allergy testing came back very positive to everything again (dust, molds, /dog dander, tree and grass . IGE is high . Eosinophils is high  Need to start a biologic . [place].  Make sure he has follow up ov set  Please contact office for sooner follow up if symptoms do not improve or worsen or seek emergency care

## 2017-11-02 NOTE — Telephone Encounter (Signed)
Patient states he received a call from our office but a message was not left.  CB is [phone].  He states he is leaving around 11:00 am today and will be out of town until Monday.

## 2017-11-07 NOTE — Telephone Encounter (Signed)
Spoke with patient. He had questions about [name] injections. Answered questions patient had at this time. If patient chooses to start process then he will contact our office.

## 2017-12-04 ENCOUNTER — Telehealth: Payer: Self-pay | Admitting: Pulmonary Disease

## 2017-12-04 MED ORDER — PREDNISONE 20 MG PO TABS: 20.0000 mg | ORAL_TABLET | Freq: Every day | ORAL | 0 refills | Status: DC

## 2017-12-04 NOTE — Telephone Encounter (Signed)
NP

## 2017-12-04 NOTE — Telephone Encounter (Signed)
Spoke with pt, he is coughing but there is no mucus production for the last two weeks. This causes him to wheeze and SOB. He states Tessalon Pearles does not work for him but would like something called in for cough. Please advise.   [facility]  Current Outpatient Medications on File Prior to Visit  Medication Sig Dispense Refill  . albuterol (PROVENTIL HFA;VENTOLIN HFA) 108 (90 Base) MCG/ACT inhaler INHALE 2 PUFFS BY MOUTH EVERY 4 TO 6 HOURS AS NEEDED 18 g 5  . benzonatate (TESSALON) 200 MG capsule Take 1 capsule (200 mg total) by mouth every 8 (eight) hours. 30 capsule 1  . cetirizine (ZYRTEC) 10 MG chewable tablet Chew 10 mg by mouth daily.    . Cholecalciferol (VITAMIN D) 1000 UNITS capsule Take 2,000 mg by mouth daily.    . empagliflozin (JARDIANCE) 10 MG TABS tablet Take 10 mg by mouth daily.    . Fluticasone-Salmeterol (ADVAIR) 250-50 MCG/DOSE AEPB Inhale 1 puff into the lungs every 12 (twelve) hours. 60 each 5  . glimepiride (AMARYL) 1 MG tablet Take 1 tablet by mouth 2 (two) times daily.     Marland Kitchen losartan (COZAAR) 100 MG tablet Take 100 mg by mouth daily.    . metFORMIN (GLUCOPHAGE) 1000 MG tablet Take 1,000 mg by mouth 2 (two) times daily.    . Multiple Vitamin (MULTIVITAMIN WITH MINERALS) TABS tablet Take 1 tablet by mouth daily.    . predniSONE (DELTASONE) 10 MG tablet 4 tabs for 2 days, then 3 tabs for 2 days, 2 tabs for 2 days, then 1 tab for 2 days, then stop 20 tablet 0  . terbinafine (LAMISIL) 250 MG tablet Take 250 mg by mouth as needed.     No current facility-administered medications on file prior to visit.    Allergies  Allergen Reactions  . Exenatide Other (See Comments)    Got knots and rash.

## 2017-12-04 NOTE — Telephone Encounter (Signed)
We have been worried about the severity of his asthma and I'm afraid that a stronger cough medicine is more of a "bandaid approach" and not treating the cause.  Let's give him prednisone  daily x 5 days then make sure he has an appointment with [place] soon (next 2-3 weeks tops) so we can address whether or not we need to add a stronger medicine for his asthma.

## 2017-12-04 NOTE — Telephone Encounter (Signed)
Called and spoke with pt stating to him BQ wanted a prescription of  prednisone to be sent in to his pharmacy to see if that would help with his symptoms.  Also stated to pt BQ wanted pt to come in soon for an appt no more than 2-3 weeks out to address asthma.  Looked at [employer] schedule and he does not have any openings until 7/9.  Dr. Kendrick Fries, do you want pt to see you directly or are you fine with [place] scheduling pt the visit with a NP?  When pt last saw TP, she had stated for pt to have a ROV with you but you do not have any openings anytime soon so wanted to know if you were fine with [place] double-booking you or should we just schedule pt with NP?  If you want [place] to double-book with you, please advise when?

## 2017-12-04 NOTE — Telephone Encounter (Signed)
I meant to send this to BQ. RB disregard.

## 2017-12-05 NOTE — Telephone Encounter (Signed)
Pt has been scheduled with TP on 12/14/17 at 9:45a. Nothing further is needed.

## 2017-12-14 ENCOUNTER — Ambulatory Visit: Payer: 59 | Admitting: Adult Health

## 2017-12-19 ENCOUNTER — Ambulatory Visit: Payer: 59 | Admitting: Pulmonary Disease

## 2017-12-22 ENCOUNTER — Encounter: Payer: Self-pay | Admitting: *Deleted

## 2017-12-24 NOTE — Progress Notes (Signed)
 @Patient  ID: Mike Hayes, male    DOB: [birth year], 52 y.o.   MRN: [id number]  Chief Complaint  Patient presents with  . Follow-up    Reports still wheezing and dry cough. Worse at night. Last chest xray 4/2. Reports no improvement noted with wheezing or cough. No longer using inhaler. Tessalon was not effective. Last prednisone dose was 5/20 x 5 days.     Referring provider: Andreas Hayes, Mike M., MD  HPI: 52 year old male never smoker followed for moderate persistent asthma with severe atopy PMH Obesity s/p gastric bypass, DM     Recent [place] [facility] Encounters:    10/17/2017 Acute OV : Asthma  Patient presents for an acute office visit.  Complains that over the last 2-3 weeks that he has had increased cough wheezing and sinus drainage.  He is changed from Zyrtec to Sudafed.  Has not helped his symptoms very much. Patient is also had a change from Asmanex to Advair due to back order status at pharmacy. He finished his last dose of Asmanex today and will begin Advair tomorrow/ Over the last year he has had several exacerbations of asthma requiring prednisone. Previous testing has showed significantly high IgE and eosinophils in the past. She has underlying diabetes and steroids to make his blood sugars increased.    Tests:  RAST + , IgE 261 PFT today shows normal lung function with FEV1 97%, ratio 78, FVC 96%, DLCO 139%.  CXR last ov with no acute 2017 Previous RAST testing very positive w/ elevated IgE. ,CBC shows very high eosinophils .  FENO testing very high at 255. Repeat FENO is much lower at 39 .    10/17/2017-chest x-ray- no edema or consolidation  09/24/2015-pulmonary function test- no evidence of airflow or lung volume abnormality diffusion capacity is markedly decreased      12/25/17 Acute  52 year old patient presenting today for asthma follow-up.  Patient recently was given a 5-day prednisone taper on 12/04/2017.  Patient reporting adherence to Advair as well as  needing to use his rescue inhaler about 2 times a day about 3 times a week.  So for a total of 6 times a week.    Patient does report that he has a consistent dry cough worsening at night when he lays flat, this dry cough tends to exacerbate and make his asthma flare up he feels.  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  Patient says that when he is doing second and third shifts he notices that the cough is worse and his diet is also worse.  Patient denies coffee or alcohol or soda use.  But patient does drink sweet tea.  Patient also reporting adherence to daily Allegra, as well as Flonase.  Allergies  Allergen Reactions  . Exenatide Other (See Comments)    Got knots and rash.    Immunization History  Administered Date(s) Administered  . Tdap 02/26/2016    Past Medical History:  Diagnosis Date  . Childhood asthma   . Diabetes mellitus   . High blood pressure   . Seasonal allergies   . Sinus trouble     Tobacco History: Social History   Tobacco Use  Smoking Status Never Smoker  Smokeless Tobacco Never Used   Counseling given: Yes Continue not smoking.   Outpatient Encounter Medications as of 12/25/2017  Medication Sig  . Cholecalciferol (VITAMIN D) 1000 UNITS capsule Take 2,000 mg by mouth daily.  .Marland Kitchen  empagliflozin (JARDIANCE) 10 MG TABS tablet Take 10 mg by mouth daily.  . fexofenadine (ALLEGRA) 60 MG tablet Take 60 mg by mouth 2 (two) times daily.  . Fluticasone-Salmeterol (ADVAIR) 250-50 MCG/DOSE AEPB Inhale 1 puff into the lungs every 12 (twelve) hours.  Marland Kitchen glimepiride (AMARYL) 1 MG tablet Take 1 tablet by mouth 2 (two) times daily.   Marland Kitchen losartan (COZAAR) 100 MG tablet Take 100 mg by mouth daily.  . metFORMIN (GLUCOPHAGE) 1000 MG tablet Take 1,000 mg by mouth 2 (two) times daily.  . Multiple Vitamin (MULTIVITAMIN WITH MINERALS) TABS tablet Take 1 tablet by mouth daily.  Marland Kitchen terbinafine (LAMISIL) 250 MG tablet Take 250 mg by  mouth as needed.  . [DISCONTINUED] cetirizine (ZYRTEC) 10 MG chewable tablet Chew 10 mg by mouth daily.  Marland Kitchen albuterol (PROVENTIL HFA;VENTOLIN HFA) 108 (90 Base) MCG/ACT inhaler INHALE 2 PUFFS BY MOUTH EVERY 4 TO 6 HOURS AS NEEDED (Patient not taking: Reported on 12/25/2017)  . benzonatate (TESSALON) 200 MG capsule Take 1 capsule (200 mg total) by mouth every 8 (eight) hours. (Patient not taking: Reported on 12/25/2017)  . montelukast (SINGULAIR) 10 MG tablet Take 1 tablet (10 mg total) by mouth at bedtime.  Marland Kitchen omeprazole (PRILOSEC) 20 MG capsule Take 1 capsule (20 mg total) by mouth daily.  . predniSONE (DELTASONE) 10 MG tablet 4 tabs for 2 days, then 3 tabs for 2 days, 2 tabs for 2 days, then 1 tab for 2 days, then stop  . predniSONE (DELTASONE) 20 MG tablet Take 1 tablet (20 mg total) by mouth daily with breakfast. (Patient not taking: Reported on 12/25/2017)  . [DISCONTINUED] predniSONE (DELTASONE) 10 MG tablet 4 tabs for 2 days, then 3 tabs for 2 days, 2 tabs for 2 days, then 1 tab for 2 days, then stop (Patient not taking: Reported on 12/25/2017)   No facility-administered encounter medications on file as of 12/25/2017.      Review of Systems  Constitutional:   No  weight loss, night sweats,  fevers, chills, fatigue, or  lassitude HEENT:   No headaches,  Difficulty swallowing,  Tooth/dental problems, or  Sore throat, No sneezing, itching, ear ache, nasal congestion, post nasal drip  CV: No chest pain,  orthopnea, PND, swelling in lower extremities, anasarca, dizziness, palpitations, syncope  GI: No heartburn, indigestion, abdominal pain, nausea, vomiting, diarrhea, change in bowel habits, loss of appetite, bloody stools Resp: +wheezing, dry cough No shortness of breath with exertion or at rest.  No excess mucus, no productive cough, No coughing up of blood.  No change in color of mucus.  No chest wall deformity Skin: no rash, lesions, no skin changes. GU: no dysuria, change in color of urine, no  urgency or frequency.  No flank pain, no hematuria  MS:  No joint pain or swelling.  No decreased range of motion.  No back pain. Psych:  No change in mood or affect. No depression or anxiety.  No memory loss.   Physical Exam  BP 128/88   Pulse 73   Ht 5\' 10"  (1.778 m)   Wt 207 lb 3.2 oz (94 kg)   SpO2 94%   BMI 29.73 kg/m    Wt Readings from Last 3 Encounters:  12/25/17 207 lb 3.2 oz (94 kg)  10/17/17 210 lb (95.3 kg)  07/19/17 210 lb (95.3 kg)    GEN: A/Ox3; pleasant , NAD, well nourished    HEENT:  [place]/AT,  EACs-clear, TMs-wnl, NOSE-clear, THROAT- erythematous, no lesions, no postnasal  drip or exudate noted.   NECK:  Supple w/ fair ROM; no JVD;  no lymphadenopathy.    RESP:  + Inspiratory and expiratory wheezes more pronounced L>R, but heard throughout lung exam, air movement through all lobes. no accessory muscle use, no dullness to percussion  CARD:  RRR, no m/r/g, no peripheral edema, pulses intact, no cyanosis or clubbing.  GI:   Soft & nt; nml bowel sounds; no organomegaly or masses detected.   Musco: Warm bil, no deformities or joint swelling noted.   Neuro: alert, no focal deficits noted.    Skin: Warm, no lesions or rashes    Lab Results:  CBC    Component Value Date/Time   WBC 6.9 10/17/2017 1446   RBC 5.64 10/17/2017 1446   HGB 16.3 10/17/2017 1446   HCT 47.5 10/17/2017 1446   PLT 133.0 (L) 10/17/2017 1446   MCV 84.3 10/17/2017 1446   MCHC 34.2 10/17/2017 1446   RDW 13.5 10/17/2017 1446   LYMPHSABS 2.1 10/17/2017 1446   MONOABS 0.5 10/17/2017 1446   EOSABS 0.8 (H) 10/17/2017 1446   BASOSABS 0.1 10/17/2017 1446    BMET No results found for: NA, K, CL, CO2, GLUCOSE, BUN, CREATININE, CALCIUM, GFRNONAA, GFRAA  BNP No results found for: BNP  ProBNP No results found for: PROBNP  Imaging: No results found.   Assessment & Plan:   Pleasant 52 year old patient seen today in office.  Continued asthma exacerbation.  Will do a prednisone taper  today.  As well as start paperwork for the [facility] biologic to start the patient on.  Patient is willing to do this but has significant concerns on cost and wanting to know what his out-of-pocket expense, co-pay would be for these injections.  I discussed with patient that we will work on the paperwork and that this is subjective based on his insurance.  We will also start patient on Singulair today.  As well as omeprazole as I feel that he is probably having GERD given his past medical history, diabetes, and clinical presentation today.  Patient to follow-up with endocrinology regarding our discussion today, as well as his prednisone taper.  Will bring patient back in 6 weeks.  Seasonal allergies Continue Allegra Added Singulair today Continue Flonase Nasal saline rinses as needed   Diabetes mellitus (HCC) Continue follow-up with endocrinology and primary care Notify endocrinology that you are requiring a another prednisone taper   Moderate persistent asthma Prednisone taper today [name] paperwork started Continue Advair Use rescue inhaler as needed    Cough Dry cough, clinical presentation sounds like GERD  We will start omeprazole 20 mg      Coral Ceo, NP 12/25/2017

## 2017-12-25 ENCOUNTER — Encounter: Payer: Self-pay | Admitting: Pulmonary Disease

## 2017-12-25 ENCOUNTER — Ambulatory Visit: Payer: 59 | Admitting: Pulmonary Disease

## 2017-12-25 ENCOUNTER — Telehealth: Payer: Self-pay | Admitting: Pulmonary Disease

## 2017-12-25 DIAGNOSIS — J4540 Moderate persistent asthma, uncomplicated: Secondary | ICD-10-CM

## 2017-12-25 DIAGNOSIS — R05 Cough: Secondary | ICD-10-CM

## 2017-12-25 DIAGNOSIS — J302 Other seasonal allergic rhinitis: Secondary | ICD-10-CM | POA: Diagnosis not present

## 2017-12-25 DIAGNOSIS — R059 Cough, unspecified: Secondary | ICD-10-CM | POA: Insufficient documentation

## 2017-12-25 MED ORDER — MONTELUKAST SODIUM 10 MG PO TABS: 10.0000 mg | ORAL_TABLET | Freq: Every day | ORAL | 11 refills | Status: DC

## 2017-12-25 MED ORDER — OMEPRAZOLE 20 MG PO CPDR: 20.0000 mg | DELAYED_RELEASE_CAPSULE | Freq: Every day | ORAL | 4 refills | Status: DC

## 2017-12-25 MED ORDER — PREDNISONE 10 MG PO TABS: ORAL_TABLET | ORAL | 0 refills | Status: DC

## 2017-12-25 NOTE — Assessment & Plan Note (Signed)
Prednisone taper today [name] paperwork started Continue Advair Use rescue inhaler as needed

## 2017-12-25 NOTE — Assessment & Plan Note (Signed)
Dry cough, clinical presentation sounds like GERD  We will start omeprazole 20 mg

## 2017-12-25 NOTE — Telephone Encounter (Signed)
[place] for [place] signed my patient, provider, and given to T. Scott.

## 2017-12-25 NOTE — Assessment & Plan Note (Signed)
Continue Allegra Added Singulair today Continue Flonase Nasal saline rinses as needed

## 2017-12-25 NOTE — Patient Instructions (Addendum)
Prednisone >>>4 tabs for 2 days, then 3 tabs for 2 days, 2 tabs for 2 days, then 1 tab for 2 days, then stop  >>> Notify endocrinology that you are requiring another prednisone taper prior to your appointment next week  We will start paperwork today for [name] for chronic management of your asthma  Singulair order placed today >>> Take 1 tablet (10 mg) daily  Omeprazole >>> Take 1 tablet (20 mg) daily >>> On empty stomach >>> Weight 30 minutes to 1 hour before eating or taking other meds after taking  Other managements for GERD >>> We will await the head of the bed, limit heavy meals, limit sweet tea, avoid eating 2 hours before bedtime  Follow-up with Dr. Kendrick FriesMcquaid in 6 weeks      Please contact the office if your symptoms worsen or you have concerns that you are not improving.   Thank you for choosing [facility] for your healthcare, and for allowing us to partner with you on your healthcare journey. I am thankful to be able to provide care to you today.   Elisha HeadlandBrian Havard Radigan FNP-C

## 2017-12-25 NOTE — Assessment & Plan Note (Signed)
Continue follow-up with endocrinology and primary care Notify endocrinology that you are requiring a another prednisone taper

## 2017-12-27 ENCOUNTER — Telehealth: Payer: Self-pay | Admitting: Pulmonary Disease

## 2017-12-27 NOTE — Telephone Encounter (Signed)
[name] enrollment form was faxed to [employer]. They sent benefit summary and called about it as well. I'm on the phone setting pt up for the savings program. [employer] will send a letter letting him know he has been enrolled in the saving program.  P/a is needed [facility] if that doesn't work I'll either call and initiate p/a or have them fax it.

## 2018-01-01 NOTE — Telephone Encounter (Signed)
Rosemary with [facility], [phone].  Calling to make sure we recd request for clinical information for [name].  States he is re-faxing again with a detailed list of what is needed.

## 2018-01-02 NOTE — Progress Notes (Signed)
Would prefer Dupixent if insurance allows

## 2018-01-02 NOTE — Telephone Encounter (Signed)
Called Mrs. Jones back. I gave her the medication he will be taking in addition to [place]. "That's all I needed, Thank you." P/A has been initiated, will leave encounter open in [place] [employer] needs something else.

## 2018-01-02 NOTE — Telephone Encounter (Signed)
Tammy, please advise. Thanks 

## 2018-01-02 NOTE — Telephone Encounter (Signed)
Larina EarthlyKelita Jones, [employer] [facility] [place], [place] [phone].  Needing to verify other medications the patient will be on while on the [name].

## 2018-01-03 NOTE — Telephone Encounter (Signed)
I returned [employer] call. They wanted to set a one time del.[name]. [name] is coming in 01/04/18. Noreene LarssonJill (rep) transferred me to Charles SchwabShawn (pharmacist) Gave verbal rx. I assume [employer] is doing this during the approval process.

## 2018-01-03 NOTE — Telephone Encounter (Signed)
Nena JordanShawndra from [employer] is calling about Harrington ChallengerFasenra [phone]

## 2018-01-04 ENCOUNTER — Telehealth: Payer: Self-pay | Admitting: Pulmonary Disease

## 2018-01-04 NOTE — Telephone Encounter (Signed)
1 prefilled syringe Ordered date: 01/03/18 Shipping date: 01/03/18

## 2018-01-04 NOTE — Telephone Encounter (Signed)
Arrival Date:01/04/2018 Lot #: [id number] Exp date: 10/2018

## 2018-01-09 ENCOUNTER — Telehealth: Payer: Self-pay | Admitting: Pulmonary Disease

## 2018-01-09 MED ORDER — PREDNISONE 10 MG PO TABS: ORAL_TABLET | ORAL | 0 refills | Status: DC

## 2018-01-09 NOTE — Telephone Encounter (Signed)
Noted.  Okay to do repeat prednisone taper from last appointment.  Prednisone 4 tabs for 2 days, then 3 tabs for 2 days, 2 tabs for 2 days, then 1 tab for 2 days  Please place the order for this.  Ensure he is adherent to his Advair.  Ultimately patient needs his Harrington ChallengerFasenra injection which last time I checked with Tammy was being delivered.  Please check with patient to see where he is in this process.  Elisha HeadlandBrian Mack FNP

## 2018-01-09 NOTE — Telephone Encounter (Signed)
Spoke with [employer]-as paperwork stated PA was needed [facility]. After speaking with Rep today-PA is NOT needed-they need to speak with patient to get approval for our office to ship medication. Lucile CraterB[employer] will go over cost of medication,etc with patient then contact our office set up shipment. Pt will need to have protocol reviewed and have Epipen sent to local pharmacy.

## 2018-01-09 NOTE — Telephone Encounter (Signed)
Spoke with patient. He was seen by Arlys JohnBrian on 12/25/17. He stated that he was prescribed a round of prednisone for similar symptoms and it seemed to help. But once he was finished with the taper, the symptoms came back. He currently has a productive cough with yellow phlegm. Increased wheezing, especially when trying to lay down. Denies any SOB, body chills or fever.   He wishes to use [facility] in [place].   TP, please advise since BQ is not available today. Thanks!

## 2018-01-09 NOTE — Telephone Encounter (Signed)
Pt aware of recs.  pred taper sent to preferred pharmacy.  Forwarding message to Dimas Millinammy Scott to follow up on [name] injection.  Patient asked about this also during our phone conversation.  Tammy please advise on status, and ensure that patient is also updated.  Thank you!

## 2018-01-09 NOTE — Telephone Encounter (Signed)
Routing to [place] as he saw patient last on 6.10.19

## 2018-01-10 ENCOUNTER — Telehealth: Payer: Self-pay | Admitting: Pulmonary Disease

## 2018-01-10 NOTE — Telephone Encounter (Signed)
Spoke with patient-he is aware that [facility] is needing him to contact them to review co-pay and consent to ship Rx to our office. Pt was given phone number of [phone] and will contact me directly if any issues with co-pay.

## 2018-01-10 NOTE — Telephone Encounter (Signed)
Spoke with pt, he states he has not heard anything from the company about shipment or cost of [place]. I didn't have a number to give him to call them to get this taken care of. TS please advise pt on who he should call. Thanks.

## 2018-01-11 ENCOUNTER — Telehealth: Payer: Self-pay | Admitting: Pulmonary Disease

## 2018-01-11 MED ORDER — EPINEPHRINE 0.3 MG/0.3ML IJ SOAJ: 0.3000 mg | Freq: Once | INTRAMUSCULAR | 11 refills | Status: AC

## 2018-01-11 NOTE — Telephone Encounter (Signed)
1 prefilled syringe Ordered date: 01/11/18 Shipping date: 01/22/18

## 2018-01-11 NOTE — Telephone Encounter (Signed)
Pt is calling to set up first INJ of [name]. Cb is [phone]

## 2018-01-11 NOTE — Telephone Encounter (Signed)
Rx Sent  

## 2018-01-11 NOTE — Telephone Encounter (Addendum)
Pt was able to get co-pay assistance. (100% covered for med.) I returned [employer]'s call and set up del. For 01/23/18. Nothing further needed.

## 2018-01-12 NOTE — Telephone Encounter (Signed)
Called pt and set up appt. For 01/15/18. Nothing further needed.

## 2018-01-15 ENCOUNTER — Ambulatory Visit (INDEPENDENT_AMBULATORY_CARE_PROVIDER_SITE_OTHER): Payer: 59

## 2018-01-15 DIAGNOSIS — J4540 Moderate persistent asthma, uncomplicated: Secondary | ICD-10-CM

## 2018-01-16 MED ORDER — BENRALIZUMAB 30 MG/ML ~~LOC~~ SOSY
30.0000 mg | PREFILLED_SYRINGE | Freq: Once | SUBCUTANEOUS | Status: AC
  Administered 2018-01-15: 30 mg via SUBCUTANEOUS

## 2018-01-22 ENCOUNTER — Telehealth: Payer: Self-pay | Admitting: *Deleted

## 2018-01-23 NOTE — Telephone Encounter (Signed)
Arrival Date:01/23/18 Lot #: [id number] Exp date: 01/2019

## 2018-01-23 NOTE — Telephone Encounter (Signed)
1 prefilled syringe Ordered date: 01/22/18 Shipping date: 01/22/18

## 2018-02-21 ENCOUNTER — Ambulatory Visit: Payer: 59 | Admitting: Pulmonary Disease

## 2018-02-21 ENCOUNTER — Encounter: Payer: Self-pay | Admitting: Pulmonary Disease

## 2018-02-21 ENCOUNTER — Ambulatory Visit (INDEPENDENT_AMBULATORY_CARE_PROVIDER_SITE_OTHER): Payer: 59

## 2018-02-21 VITALS — BP 128/62 | HR 69 | Ht 70.0 in | Wt 210.0 lb

## 2018-02-21 DIAGNOSIS — J4540 Moderate persistent asthma, uncomplicated: Secondary | ICD-10-CM

## 2018-02-21 DIAGNOSIS — K219 Gastro-esophageal reflux disease without esophagitis: Secondary | ICD-10-CM | POA: Diagnosis not present

## 2018-02-21 DIAGNOSIS — J302 Other seasonal allergic rhinitis: Secondary | ICD-10-CM | POA: Diagnosis not present

## 2018-02-21 NOTE — Patient Instructions (Signed)
Severe persistent asthma with recurrent exacerbations and elevated serum eosinophil count: I am glad you are doing better with [name] injections From my standpoint you could transition to taking these at home Continue Advair twice a day Continue Singulair  Gastroesophageal reflux disease: Continue omeprazole daily  Is going to be important to stay active, practice good hand hygiene and get a flu shot in the fall  I will see you back in 3 to 4 months or sooner if needed

## 2018-02-21 NOTE — Progress Notes (Signed)
Subjective:    Patient ID: Mike Hayes, male    DOB: 1965-11-21, 52 y.o.   MRN: [id number]  Synopsis: Former patient of Dr. Delford Field with moderate persistent asthma that flared up in 2009.  - flare 03/14/14 and 06/06/14 on asthmanex  Had repeated exacerbations in late 2018 through early 2019, started on [name] Spring 2019   HPI Chief Complaint  Patient presents with  . Follow-up    received 2nd [name] injection today.  doing well on injections.     Mike Hayes has been doing well since he started taking [place].  He notes he notes that since the last round of prednisone he has received 2 injections of the new medicine and he has been feeling well.  He denies dyspnea, chest tightness, cough or shortness of breath.  He is compliant with his omeprazole and his Advair.  He reports only having acid reflux symptoms from time to time with certain meals but is not very frequent.  He still takes his Singulair.  He denies significant sinus symptoms.  He says there is been no major changes in his home in the last 7 years.  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  He cannot identify any sort of significant change that led to the worsening flareups this year.  Past Medical History:  Diagnosis Date  . Childhood asthma   . Diabetes mellitus   . High blood pressure   . Seasonal allergies   . Sinus trouble       Review of Systems  Constitutional: Negative for chills, fatigue and fever.  HENT: Negative for postnasal drip, rhinorrhea and sinus pressure.   Respiratory: Negative for cough, chest tightness, shortness of breath and wheezing.   Cardiovascular: Negative for chest pain, palpitations and leg swelling.       Objective:   Physical Exam Vitals:   02/21/18 0919  BP: 128/62  Pulse: 69  SpO2: 97%  Weight: 210 lb (95.3 kg)  Height: 5\' 10"  (1.778 m)   Gen: well appearing HENT: OP clear, TM's clear, neck supple PULM: CTA B, normal  percussion CV: RRR, no mgr, trace edema GI: BS+, soft, nontender Derm: no cyanosis or rash Psyche: normal mood and affect   CBC    Component Value Date/Time   WBC 6.9 10/17/2017 1446   RBC 5.64 10/17/2017 1446   HGB 16.3 10/17/2017 1446   HCT 47.5 10/17/2017 1446   PLT 133.0 (L) 10/17/2017 1446   MCV 84.3 10/17/2017 1446   MCHC 34.2 10/17/2017 1446   RDW 13.5 10/17/2017 1446   LYMPHSABS 2.1 10/17/2017 1446   MONOABS 0.5 10/17/2017 1446   EOSABS 0.8 (H) 10/17/2017 1446   BASOSABS 0.1 10/17/2017 1446     Labs: >>RAST +++positive , IgE >700, +++eosinophils  08/04/15, high nitric oxide   PFT 09/2015 PFTs > Ratio 78%, FEV1 3.78 L (78%, 5% change), FVC 4.82L (99% pred), TLC 7.0 L (103% pred), DLCO 44.6 (139% pred)  Records from his most recent visits with [place] reviewed where he had recurrent exacerbations and was started on a newer biologic agent for his asthma.     Assessment & Plan:   Moderate persistent asthma, unspecified whether complicated  Seasonal allergies  Gastroesophageal reflux disease, esophagitis presence not specified  Discussion: [name] has severe persistent asthma with recurrent exacerbations and elevated serum eosinophil count.  He has struggled significantly over the last year with recurrent exacerbations  but since starting on the IL-5 inhibitor antibody his symptoms have improved significantly.  I cannot identify anything by history or comorbid illness assessment today which triggered this.  I think the best approach is to continue the IL-5 inhibitor antibody for 12 to 18 months.  He is compliant with his other medications and I encouraged him to remain compliant with them.  We also talked about the importance of getting a flu shot and staying active.  Plan: Severe persistent asthma with recurrent exacerbations and elevated serum eosinophil count: I am glad you are doing better with [name] injections From my standpoint you could transition to taking these  at home Continue Advair twice a day Continue Singulair  Gastroesophageal reflux disease: Continue omeprazole daily  Is going to be important to stay active, practice good hand hygiene and get a flu shot in the fall  I will see you back in 3 to 4 months or sooner if needed     Current Outpatient Medications:  .  albuterol (PROVENTIL HFA;VENTOLIN HFA) 108 (90 Base) MCG/ACT inhaler, INHALE 2 PUFFS BY MOUTH EVERY 4 TO 6 HOURS AS NEEDED, Disp: 18 g, Rfl: 5 .  Cholecalciferol (VITAMIN D) 1000 UNITS capsule, Take 2,000 mg by mouth daily., Disp: , Rfl:  .  empagliflozin (JARDIANCE) 10 MG TABS tablet, Take 10 mg by mouth daily., Disp: , Rfl:  .  Fluticasone-Salmeterol (ADVAIR) 250-50 MCG/DOSE AEPB, Inhale 1 puff into the lungs every 12 (twelve) hours., Disp: 60 each, Rfl: 5 .  glimepiride (AMARYL) 1 MG tablet, Take 1 tablet by mouth 2 (two) times daily. , Disp: , Rfl:  .  losartan (COZAAR) 100 MG tablet, Take 100 mg by mouth daily., Disp: , Rfl:  .  metFORMIN (GLUCOPHAGE) 1000 MG tablet, Take 1,000 mg by mouth 2 (two) times daily., Disp: , Rfl:  .  montelukast (SINGULAIR) 10 MG tablet, Take 1 tablet (10 mg total) by mouth at bedtime., Disp: 30 tablet, Rfl: 11 .  Multiple Vitamin (MULTIVITAMIN WITH MINERALS) TABS tablet, Take 1 tablet by mouth daily., Disp: , Rfl:  .  omeprazole (PRILOSEC) 20 MG capsule, Take 1 capsule (20 mg total) by mouth daily., Disp: 30 capsule, Rfl: 4

## 2018-02-22 NOTE — Progress Notes (Signed)
Documentation of medication administration and charges of [name] have been completed by Breda Bond, CMA based on the [name] documentation sheet completed by Tammy Scott.   

## 2018-02-23 MED ORDER — BENRALIZUMAB 30 MG/ML ~~LOC~~ SOSY
30.0000 mg | PREFILLED_SYRINGE | Freq: Once | SUBCUTANEOUS | Status: AC
Start: 2018-02-23 — End: 2018-02-21
  Administered 2018-02-21: 30 mg via SUBCUTANEOUS

## 2018-03-02 ENCOUNTER — Telehealth: Payer: Self-pay | Admitting: Pulmonary Disease

## 2018-03-02 NOTE — Telephone Encounter (Signed)
Arrival Date:03/02/18 Lot #: [id number] Exp date: 01/2019 

## 2018-03-23 DIAGNOSIS — B3789 Other sites of candidiasis: Secondary | ICD-10-CM | POA: Insufficient documentation

## 2018-03-26 ENCOUNTER — Ambulatory Visit (INDEPENDENT_AMBULATORY_CARE_PROVIDER_SITE_OTHER): Payer: 59

## 2018-03-26 DIAGNOSIS — J4540 Moderate persistent asthma, uncomplicated: Secondary | ICD-10-CM | POA: Diagnosis not present

## 2018-03-28 MED ORDER — BENRALIZUMAB 30 MG/ML ~~LOC~~ SOSY
30.0000 mg | PREFILLED_SYRINGE | Freq: Once | SUBCUTANEOUS | Status: AC
  Administered 2018-03-26: 30 mg via SUBCUTANEOUS

## 2018-03-28 NOTE — Progress Notes (Signed)
Documentation of medication administration and charges of [name] have been completed by Jaynee Eagles, CMA based on the [facility] documentation sheet completed by Dorethea Clan, RMA, who administered the medication.

## 2018-03-29 ENCOUNTER — Ambulatory Visit: Payer: 59

## 2018-05-27 ENCOUNTER — Other Ambulatory Visit: Payer: Self-pay | Admitting: Pulmonary Disease

## 2018-05-27 DIAGNOSIS — R059 Cough, unspecified: Secondary | ICD-10-CM

## 2018-05-27 DIAGNOSIS — R05 Cough: Secondary | ICD-10-CM

## 2018-05-28 ENCOUNTER — Telehealth: Payer: Self-pay | Admitting: Pulmonary Disease

## 2018-05-28 NOTE — Telephone Encounter (Addendum)
1 prefilled syringe Ordered date: 05/28/18 Shipping date: 05/29/18

## 2018-05-31 NOTE — Telephone Encounter (Signed)
Arrival Date:05/1318  Lot #: [id number] Exp date: 6/20212

## 2018-06-05 NOTE — Progress Notes (Signed)
@Patient  ID: Mike Hayes, male    DOB: 1966-02-27, 52 y.o.   MRN: [id number]  Chief Complaint  Patient presents with  . Follow-up    Asthma, on [name]    Referring provider: Andreas Blower., MD  HPI:  52 year old male never smoker followed in our office for severe persistent asthma.  Patient has had multiple flares in late 2018 as well as early 2019 and started on [name] in spring 2019.  PMH: GERD Smoker/ Smoking History: Never Smoker  Maintenance: [name]  Pt of: Dr. Kendrick Fries    06/06/2018  - Visit   52 year old male non-smoker following up with our office today.  Patient is managed in our office for asthma.  Patient has been doing quite well since starting [name] injections.  Patient reports that his symptoms have been well managed and starting [name] injections.  Patient says he has not breathe this well in years.  Patient stopped his Advair inhaler 2 months ago and has not needed his rescue inhaler either since starting [name] injections.  Patient refuses to take additional inhalers at this time.  Patient does not believe that he needs them after how well the [name] injections are working for the patient.  ACT score today is 25.  See below.  Asthma Severity (on [name]): Mild intermittent-symptoms less than 2 days/week or less than 2 nights per month, exacerbations are brief   Asthma Control Test Answer Options: All of the time-1, most of the time-2, some of the time-3, a little of the time-4, none of the time-5  In the past 4 weeks how much of your time did your asthma keep you from getting as much work done at school, home, work? 5 During the past 4 weeks how often if he had short of breath? 5 During the past 4 weeks how often is your asthma symptoms (wheezing, coughing, shortness of breath, chest tightness, or pain) wake you up at night or earlier than usual in the morning? 5 During the past 4 weeks how often have you used her rescue inhaler and nebulized medications  (such as albuterol)? 5 How would you rate your asthma control during the past 4 weeks? 5  If score is 19 or less indicates your asthma may not be under control.  Score: 25     Tests:   >>RAST +++positive , IgE >700, +++eosinophils  08/04/15  10/17/17 - CBC with diff - eosinophils absolute 0.8, eosinophils relative 12.3, 10/17/17 - RAST panel-multiple elevations, significant for fungus, dust mites, cat, dog, Box Elder, trees, pecans, multiple grass foods, IgE 629  PFT 09/2015 PFTs > Ratio 78%, FEV1 3.78 L (78%, 5% change), FVC 4.82L (99% pred), TLC 7.0 L (103% pred), DLCO 44.6 (139% pred)  Records from his most recent visits with [place] reviewed where he had recurrent exacerbations and was started on a newer biologic agent for his asthma.  FENO:  Lab Results  Component Value Date   NITRICOXIDE 36 07/19/2017    PFT: PFT Results Latest Ref Rng & Units 09/24/2015  FVC-Pre L 4.68  FVC-Predicted Pre % 93  FVC-Post L 4.82  FVC-Predicted Post % 96  Pre FEV1/FVC % % 77  Post FEV1/FCV % % 78  FEV1-Pre L 3.59  FEV1-Predicted Pre % 92  FEV1-Post L 3.78  DLCO UNC% % 139  DLCO COR %Predicted % 143  TLC L 7.10  TLC % Predicted % 103  RV % Predicted % 71    Imaging: No results found.  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 Exenatide Other (See Comments)    Got knots and rash.     Immunization History  Administered Date(s) Administered  . Tdap 02/26/2016    Past Medical History:  Diagnosis Date  . Childhood asthma   . Diabetes mellitus   . High blood pressure   . Seasonal allergies   . Sinus trouble     Tobacco History: Social History   Tobacco Use  Smoking Status Never Smoker  Smokeless Tobacco Never Used   Counseling given: Yes  Continue not smoking   Outpatient Encounter Medications as of 06/06/2018  Medication Sig  . albuterol (PROVENTIL HFA;VENTOLIN HFA) 108 (90 Base) MCG/ACT inhaler INHALE 2 PUFFS BY MOUTH EVERY 4 TO 6 HOURS AS NEEDED  . Cholecalciferol (VITAMIN D) 1000 UNITS capsule Take 2,000 mg by mouth daily.  . Fluticasone-Salmeterol (ADVAIR) 250-50 MCG/DOSE AEPB Inhale 1 puff into the lungs every 12 (twelve) hours.  Marland Kitchen glimepiride (AMARYL) 1 MG tablet Take 1 tablet by mouth 2 (two) times daily.   Marland Kitchen losartan (COZAAR) 100 MG tablet Take 100 mg by mouth daily.  . metFORMIN (GLUCOPHAGE) 1000 MG tablet Take 1,000 mg by mouth 2 (two) times daily.  . montelukast (SINGULAIR) 10 MG tablet Take 1 tablet (10 mg total) by mouth at bedtime.  . Multiple Vitamin (MULTIVITAMIN WITH MINERALS) TABS tablet Take 1 tablet by mouth daily.  Marland Kitchen omeprazole (PRILOSEC) 20 MG capsule TAKE 1 CAPSULE BY MOUTH ONCE DAILY  . [DISCONTINUED] empagliflozin (JARDIANCE) 10 MG TABS tablet Take 10 mg by mouth daily.   No facility-administered encounter medications on file as of 06/06/2018.     Review of Systems  Review of Systems  Constitutional: Negative for activity change, chills, fatigue, fever and unexpected weight change.  HENT: Negative for congestion, postnasal drip, rhinorrhea, sinus pressure, sneezing and sore throat.   Eyes: Negative.   Respiratory: Negative for cough, shortness of breath and wheezing.   Cardiovascular: Negative for chest pain and palpitations.  Gastrointestinal: Negative for diarrhea, nausea and vomiting.       +denies indigestion or  GERD symptoms   Endocrine: Negative.   Musculoskeletal: Negative.   Skin: Negative.   Neurological: Negative for dizziness and headaches.  Psychiatric/Behavioral: Negative.  Negative for dysphoric mood. The patient is not nervous/anxious.   All other systems reviewed and are negative.    Physical Exam  BP 130/78 (BP Location: Left Arm, Cuff Size: Normal)   Pulse 63   Ht 5\' 10"  (1.778 m)   Wt 213 lb 12.8 oz (97 kg)   SpO2 98%   BMI 30.68 kg/m   Wt Readings from Last 5 Encounters:  06/06/18 213 lb 12.8 oz (97 kg)  02/21/18 210 lb (95.3 kg)  12/25/17 207 lb 3.2 oz (94 kg)  10/17/17 210 lb (95.3 kg)  07/19/17 210 lb (95.3 kg)  Physical Exam  Constitutional: He is oriented to person, place, and time and well-developed, well-nourished, and in no distress. No distress.  HENT:  Head: Normocephalic and atraumatic.  Right Ear: Hearing, tympanic membrane, external ear and ear canal normal.  Left Ear: Hearing, tympanic membrane, external ear and ear canal normal.  Nose: Mucosal edema present. Right sinus exhibits no maxillary sinus tenderness and no frontal sinus tenderness. Left sinus exhibits no maxillary sinus tenderness and no frontal sinus tenderness.  Mouth/Throat: Uvula is midline and oropharynx is clear and moist. No oropharyngeal exudate.  Eyes: Pupils are equal, round, and reactive to light.  Neck: Normal range of motion. Neck supple. No JVD present.  Cardiovascular: Normal rate, regular rhythm and normal heart sounds.  Pulmonary/Chest: Effort normal and breath sounds normal. No accessory muscle usage. No respiratory distress. He has no decreased breath sounds. He has no wheezes. He has no rhonchi.  Abdominal: Soft. Bowel sounds are normal. There is no tenderness.  Musculoskeletal: Normal range of motion. He exhibits no edema.  Lymphadenopathy:    He has no cervical adenopathy.  Neurological: He is alert and oriented to person, place, and time. Gait normal.  Skin: Skin is  warm and dry. He is not diaphoretic. No erythema.  Psychiatric: Mood, memory, affect and judgment normal.  Nursing note and vitals reviewed.    Lab Results:  CBC    Component Value Date/Time   WBC 6.9 10/17/2017 1446   RBC 5.64 10/17/2017 1446   HGB 16.3 10/17/2017 1446   HCT 47.5 10/17/2017 1446   PLT 133.0 (L) 10/17/2017 1446   MCV 84.3 10/17/2017 1446   MCHC 34.2 10/17/2017 1446   RDW 13.5 10/17/2017 1446   LYMPHSABS 2.1 10/17/2017 1446   MONOABS 0.5 10/17/2017 1446   EOSABS 0.8 (H) 10/17/2017 1446   BASOSABS 0.1 10/17/2017 1446    BMET No results found for: NA, K, CL, CO2, GLUCOSE, BUN, CREATININE, CALCIUM, GFRNONAA, GFRAA  BNP No results found for: BNP  ProBNP No results found for: PROBNP    Assessment & Plan:   52 year old male patient completing follow-up with our office today.  Patient is doing quite well on [name] with no current symptoms.  I am okay with the patient being off of Advair at this time.  Patient is to resume Advair if he starts having any sort of respiratory changes or worsening symptoms.  Patient can follow-up with our office in 6 months and continue for center injections.  Patient may be a good candidate in the future for home injections as patient has good family support, patient is comfortable with giving himself a shot, patient works swing shifts which can make scheduling for injections in our office difficult.  Patient has received 4 months of injections and done quite well.  We could consider this in the spring 2020 if patient is interested.  Allergic rhinitis Follow up 6 months or sooner if symptoms worsen   Continue [name] injections   Restart advair if asthma symptoms worsen   Only use your albuterol as a rescue medication to be used if you can't catch your breath by resting or doing a relaxed purse lip breathing pattern.  - The less you use it, the better it will work when you need it. - Ok to use up to 2 puffs  every 4 hours if you  must but call for immediate appointment if use goes up over your usual need - Don't leave home without it !!  (think of it like  the spare tire for your car)   Continue to avoid know triggers   Local pollens: Tree pollen-seen more in spring Grass pollen-seen more than summertime Weeds-seen More in fall  Perennial allergens: Dust mite, cockroach, mold indoor, animal dander  Mold-seen more mid summer and fall  Dust Mite Allergy  >>>levels of these increase in November / December  >>>this can heighten your bronchial hypersensitivity and increase chance of respiratory exacerbation  >>> Treatment options: Can get dust mite covers, dry pillows weekly and dryer, in [place] mattress with cover, cleaning carpets regularly if you have them  Moderate persistent asthma (well managed on [name]) Follow up 6 months or sooner if symptoms worsen   Continue [name] injections   Restart advair if asthma symptoms worsen   Only use your albuterol as a rescue medication to be used if you can't catch your breath by resting or doing a relaxed purse lip breathing pattern.  - The less you use it, the better it will work when you need it. - Ok to use up to 2 puffs  every 4 hours if you must but call for immediate appointment if use goes up over your usual need - Don't leave home without it !!  (think of it like the spare tire for your car)    Continue to avoid know triggers   Local pollens: Tree pollen-seen more in spring Grass pollen-seen more than summertime Weeds-seen More in fall  Perennial allergens: Dust mite, cockroach, mold indoor, animal dander  Mold-seen more mid summer and fall  Dust Mite Allergy  >>>levels of these increase in November / December  >>>this can heighten your bronchial hypersensitivity and increase chance of respiratory exacerbation  >>> Treatment options: Can get dust mite covers, dry pillows weekly and dryer, in [place] mattress with cover, cleaning carpets regularly if you  have them     Coral CeoBrian P Chriss Redel, NP 06/06/2018

## 2018-06-06 ENCOUNTER — Ambulatory Visit (INDEPENDENT_AMBULATORY_CARE_PROVIDER_SITE_OTHER): Payer: 59 | Admitting: Pulmonary Disease

## 2018-06-06 ENCOUNTER — Encounter: Payer: Self-pay | Admitting: Pulmonary Disease

## 2018-06-06 ENCOUNTER — Ambulatory Visit (INDEPENDENT_AMBULATORY_CARE_PROVIDER_SITE_OTHER): Payer: 59

## 2018-06-06 ENCOUNTER — Ambulatory Visit: Payer: 59 | Admitting: Pulmonary Disease

## 2018-06-06 DIAGNOSIS — J301 Allergic rhinitis due to pollen: Secondary | ICD-10-CM

## 2018-06-06 DIAGNOSIS — J4540 Moderate persistent asthma, uncomplicated: Secondary | ICD-10-CM | POA: Diagnosis not present

## 2018-06-06 MED ORDER — BENRALIZUMAB 30 MG/ML ~~LOC~~ SOSY
30.0000 mg | PREFILLED_SYRINGE | Freq: Once | SUBCUTANEOUS | Status: AC
  Administered 2018-06-06: 30 mg via SUBCUTANEOUS

## 2018-06-06 NOTE — Assessment & Plan Note (Signed)
Follow up 6 months or sooner if symptoms worsen   Continue [name] injections   Restart advair if asthma symptoms worsen   Only use your albuterol as a rescue medication to be used if you can't catch your breath by resting or doing a relaxed purse lip breathing pattern.  - The less you use it, the better it will work when you need it. - Ok to use up to 2 puffs  every 4 hours if you must but call for immediate appointment if use goes up over your usual need - Don't leave home without it !!  (think of it like the spare tire for your car)   Continue to avoid know triggers   Local pollens: Tree pollen-seen more in spring Grass pollen-seen more than summertime Weeds-seen More in fall  Perennial allergens: Dust mite, cockroach, mold indoor, animal dander  Mold-seen more mid summer and fall  Dust Mite Allergy  >>>levels of these increase in November / December  >>>this can heighten your bronchial hypersensitivity and increase chance of respiratory exacerbation  >>> Treatment options: Can get dust mite covers, dry pillows weekly and dryer, in [place] mattress with cover, cleaning carpets regularly if you have them 

## 2018-06-06 NOTE — Assessment & Plan Note (Signed)
Follow up 6 months or sooner if symptoms worsen   Continue [name] injections   Restart advair if asthma symptoms worsen   Only use your albuterol as a rescue medication to be used if you can't catch your breath by resting or doing a relaxed purse lip breathing pattern.  - The less you use it, the better it will work when you need it. - Ok to use up to 2 puffs  every 4 hours if you must but call for immediate appointment if use goes up over your usual need - Don't leave home without it !!  (think of it like the spare tire for your car)   Continue to avoid know triggers   Local pollens: Tree pollen-seen more in spring Grass pollen-seen more than summertime Weeds-seen More in fall  Perennial allergens: Dust mite, cockroach, mold indoor, animal dander  Mold-seen more mid summer and fall  Dust Mite Allergy  >>>levels of these increase in November / December  >>>this can heighten your bronchial hypersensitivity and increase chance of respiratory exacerbation  >>> Treatment options: Can get dust mite covers, dry pillows weekly and dryer, in [place] mattress with cover, cleaning carpets regularly if you have them

## 2018-06-06 NOTE — Progress Notes (Signed)
Documentation of medication administration and charges of [name] have been completed by Lindsay Lemons, CMA based on the hand written [name] documentation sheet completed by Tammy Scott, who administered the medication.  

## 2018-06-06 NOTE — Progress Notes (Signed)
Reviewed, agree 

## 2018-06-06 NOTE — Patient Instructions (Addendum)
Follow up 6 months or sooner if symptoms worsen   Continue [name] injections   Restart advair if asthma symptoms worsen   Only use your albuterol as a rescue medication to be used if you can't catch your breath by resting or doing a relaxed purse lip breathing pattern.  - The less you use it, the better it will work when you need it. - Ok to use up to 2 puffs  every 4 hours if you must but call for immediate appointment if use goes up over your usual need - Don't leave home without it !!  (think of it like the spare tire for your car)    Continue to avoid know triggers   Local pollens: Tree pollen-seen more in spring Grass pollen-seen more than summertime Weeds-seen More in fall  Perennial allergens: Dust mite, cockroach, mold indoor, animal dander  Mold-seen more mid summer and fall  Dust Mite Allergy  >>>levels of these increase in November / December  >>>this can heighten your bronchial hypersensitivity and increase chance of respiratory exacerbation  >>> Treatment options: Can get dust mite covers, dry pillows weekly and dryer, in [place] mattress with cover, cleaning carpets regularly if you have them   It is flu season:   >>>Remember to be washing your hands regularly, using hand sanitizer, be careful to use around herself with has contact with people who are sick will increase her chances of getting sick yourself. >>> Best ways to protect herself from the flu: Receive the yearly flu vaccine, practice good hand hygiene washing with soap and also using hand sanitizer when available, eat a nutritious meals, get adequate rest, hydrate appropriately   Please contact the office if your symptoms worsen or you have concerns that you are not improving.   Thank you for choosing [facility] for your healthcare, and for allowing us to partner with you on your healthcare journey. I am thankful to be able to provide care to you today.   Elisha HeadlandBrian  FNP-C

## 2018-08-06 ENCOUNTER — Telehealth: Payer: Self-pay | Admitting: Pulmonary Disease

## 2018-08-06 ENCOUNTER — Ambulatory Visit: Payer: 59

## 2018-08-06 NOTE — Telephone Encounter (Addendum)
1 prefilled syringe Ordered date: 08/06/2018 Shipping date:  Void I was on eternal hold and it was after 5:30, I had to hang up. Please see 08/07/2018 note for updates.

## 2018-08-07 NOTE — Telephone Encounter (Addendum)
I tried to order pt's [name], 3 different times. Pt's medical benefit is under review. Once this is completed, [facility] has to call the pt to get his consent and then they'll call us back to set up del.Ander Slade. Joy is supposed to cmb.Ander Slade. Joy called back, Pt needs a P/A. She just transferred me to the P/A dept.. Pt was approved: Ref# [id number] dates:08/07/2018 thru 08/08/2019. Called [facility] back, they transferred me to office del., I was able to set up the order with that rep.. 1 prefilled syringe Ordered date: 08/07/2018 Shipping date: 08/07/2018 [facility] called pt to get his consent to ship, lmom.  I called in [place] pt was screening his calls, I had to leave a message as well. Asked pt to please call [facility] so they can del. His [name] 08/08/2018. I also asked him to call me or Katie (after 4:00) to reschedule his appt., his med won't be here that early  Arrival Date:09/08/2018 Lot #: [id number] Exp date: 02/2020

## 2018-08-08 ENCOUNTER — Ambulatory Visit: Payer: 59

## 2018-08-13 ENCOUNTER — Ambulatory Visit (INDEPENDENT_AMBULATORY_CARE_PROVIDER_SITE_OTHER): Payer: 59

## 2018-08-13 DIAGNOSIS — J4540 Moderate persistent asthma, uncomplicated: Secondary | ICD-10-CM

## 2018-08-13 MED ORDER — BENRALIZUMAB 30 MG/ML ~~LOC~~ SOSY
30.0000 mg | PREFILLED_SYRINGE | Freq: Once | SUBCUTANEOUS | Status: AC
  Administered 2018-08-13: 30 mg via SUBCUTANEOUS

## 2018-09-20 ENCOUNTER — Telehealth: Payer: Self-pay | Admitting: Pulmonary Disease

## 2018-09-20 NOTE — Telephone Encounter (Signed)
Arrival Date:09/20/2018 Lot #: [id number] Exp date: 03/2020

## 2018-09-20 NOTE — Telephone Encounter (Signed)
1 prefilled syringe Ordered date: 09/19/2018 Shipping date: 09/19/2018

## 2018-10-15 ENCOUNTER — Other Ambulatory Visit: Payer: Self-pay

## 2018-10-15 ENCOUNTER — Ambulatory Visit (INDEPENDENT_AMBULATORY_CARE_PROVIDER_SITE_OTHER): Payer: 59

## 2018-10-15 DIAGNOSIS — J4540 Moderate persistent asthma, uncomplicated: Secondary | ICD-10-CM | POA: Diagnosis not present

## 2018-10-15 MED ORDER — BENRALIZUMAB 30 MG/ML ~~LOC~~ SOSY
30.0000 mg | PREFILLED_SYRINGE | Freq: Once | SUBCUTANEOUS | Status: AC
  Administered 2018-10-15: 30 mg via SUBCUTANEOUS

## 2018-10-25 ENCOUNTER — Other Ambulatory Visit: Payer: Self-pay | Admitting: Pulmonary Disease

## 2018-10-25 DIAGNOSIS — R059 Cough, unspecified: Secondary | ICD-10-CM

## 2018-10-25 DIAGNOSIS — R05 Cough: Secondary | ICD-10-CM

## 2018-10-31 ENCOUNTER — Telehealth: Payer: Self-pay | Admitting: Pulmonary Disease

## 2018-10-31 NOTE — Telephone Encounter (Signed)
[name] shipment received for patient's next injection 30mg  #1 prefilled syringe Medication arrival Date:10/31/2018 Lot #: [id number] Medication exp date: 03/2020

## 2018-11-01 ENCOUNTER — Encounter: Payer: Self-pay | Admitting: Pulmonary Disease

## 2018-11-26 ENCOUNTER — Other Ambulatory Visit: Payer: Self-pay | Admitting: Pulmonary Disease

## 2018-11-26 DIAGNOSIS — R059 Cough, unspecified: Secondary | ICD-10-CM

## 2018-11-26 DIAGNOSIS — R05 Cough: Secondary | ICD-10-CM

## 2018-12-05 ENCOUNTER — Ambulatory Visit: Payer: 59 | Admitting: Pulmonary Disease

## 2018-12-05 ENCOUNTER — Ambulatory Visit (INDEPENDENT_AMBULATORY_CARE_PROVIDER_SITE_OTHER): Payer: 59 | Admitting: Nurse Practitioner

## 2018-12-05 ENCOUNTER — Encounter: Payer: Self-pay | Admitting: Nurse Practitioner

## 2018-12-05 ENCOUNTER — Other Ambulatory Visit: Payer: Self-pay

## 2018-12-05 DIAGNOSIS — J4540 Moderate persistent asthma, uncomplicated: Secondary | ICD-10-CM

## 2018-12-05 NOTE — Patient Instructions (Signed)
May stop Singulair Continue fasenra injections Continue albuterol as needed  Follow up: Follow up with Dr. Kendrick Fries in 6 months or sooner if needed

## 2018-12-05 NOTE — Assessment & Plan Note (Signed)
Patient continues to be well managed on [name] injections.  He denies any asthma flares in the past 6 months.  He has not had to use his rescue inhaler in several months.  He would like to quit Singulair.  He has already quit using his Advair.  Patient Instructions  May stop Singulair Continue fasenra injections Continue albuterol as needed  Follow up: Follow up with Dr. Kendrick Fries in 6 months or sooner if needed

## 2018-12-05 NOTE — Progress Notes (Signed)
Reviewed, agree 

## 2018-12-05 NOTE — Progress Notes (Signed)
Virtual Visit via Telephone Note  I connected with Son Bonesteel Jaquith on 12/05/18 at  9:00 AM EDT by telephone and verified that I am speaking with the correct person using two identifiers.  Location: Patient: home Provider: office   I discussed the limitations, risks, security and privacy concerns of performing an evaluation and management service by telephone and the availability of in person appointments. I also discussed with the patient that there may be a patient responsible charge related to this service. The patient expressed understanding and agreed to proceed.   History of Present Illness: 53 year old male never smoker followed for severe persistent asthma by Dr. Kendrick Fries. Patient started [name] in spring 2019  Patient has a tele-visit today for a follow-up.  He states that overall he has been doing well.  Patient is taking [name] now for asthma and states that it is working well.  He states that he has quit taking Advair and Prilosec.  He still has albuterol for rescue inhaler but has not needed to use it in several months.  He would like to stop taking Singulair at this point.  He denies having any flares in the past 6 months.  Denies f/c/s, n/v/d, hemoptysis, PND, leg swelling.     Observations/Objective: >>RAST +++positive , IgE >700, +++eosinophils  08/04/15  10/17/17 - CBC with diff - eosinophils absolute 0.8, eosinophils relative 12.3, 10/17/17 - RAST panel-multiple elevations, significant for fungus, dust mites, cat, dog, Box Elder, trees, pecans, multiple grass foods, IgE 629  PFT 09/2015 PFTs > Ratio 78%, FEV1 3.78 L (78%, 5% change), FVC 4.82L (99% pred), TLC 7.0 L (103% pred), DLCO 44.6 (139% pred)  Records from his most recent visits with [place] reviewed where he had recurrent exacerbations and was started on a newer biologic agent for his asthma.    Benralizumab SOSY 30 mg    Date Action Dose Route User   10/15/2018 [phone] Given 30 mg Subcutaneous (Left Arm) Scott, Tammy  B      Assessment and Plan: Patient continues to be well managed on [name] injections.  He denies any asthma flares in the past 6 months.  He has not had to use his rescue inhaler in several months.  He would like to quit Singulair.  He has already quit using his Advair.  Patient Instructions  May stop Singulair Continue fasenra injections Continue albuterol as needed   Follow Up Instructions:  Follow up with Dr. Kendrick Fries in 6 months or sooner if needed    I discussed the assessment and treatment plan with the patient. The patient was provided an opportunity to ask questions and all were answered. The patient agreed with the plan and demonstrated an understanding of the instructions.   The patient was advised to call back or seek an in-person evaluation if the symptoms worsen or if the condition fails to improve as anticipated.  I provided 23 minutes of non-face-to-face time during this encounter.   Ivonne Andrew, NP

## 2018-12-12 ENCOUNTER — Ambulatory Visit (INDEPENDENT_AMBULATORY_CARE_PROVIDER_SITE_OTHER): Payer: 59

## 2018-12-12 ENCOUNTER — Other Ambulatory Visit: Payer: Self-pay

## 2018-12-12 DIAGNOSIS — J4540 Moderate persistent asthma, uncomplicated: Secondary | ICD-10-CM

## 2018-12-12 MED ORDER — BENRALIZUMAB 30 MG/ML ~~LOC~~ SOSY
30.0000 mg | PREFILLED_SYRINGE | Freq: Once | SUBCUTANEOUS | Status: AC
  Administered 2018-12-12: 09:00:00 30 mg via SUBCUTANEOUS

## 2018-12-12 NOTE — Progress Notes (Signed)
Have you been hospitalized within the last 10 days?  No Do you have a fever?  No Do you have a cough?  No Do you have a headache or sore throat? No  

## 2018-12-26 ENCOUNTER — Other Ambulatory Visit: Payer: Self-pay | Admitting: Pulmonary Disease

## 2018-12-26 DIAGNOSIS — R05 Cough: Secondary | ICD-10-CM

## 2018-12-26 DIAGNOSIS — R059 Cough, unspecified: Secondary | ICD-10-CM

## 2018-12-26 NOTE — Telephone Encounter (Signed)
Received refill request for omeprazole. It seems like on 5/11, pt was taken of omeprazole as it states there was a change in therapy.  Tonya, please advise if you are okay with [place] refilling the omeprazole for pt. Thanks!

## 2018-12-26 NOTE — Telephone Encounter (Signed)
He reported that he had quit taking the medication. It is OK to refill if he still wants to take it.

## 2019-01-20 IMAGING — DX DG CHEST 2V
2 series · 2 of 2 positions shown · non-contrast
Comparison: August 03, 2015

CLINICAL DATA: Shortness of breath and wheezing.  Cough.

EXAM:
CHEST - 2 VIEW

[chest pa]
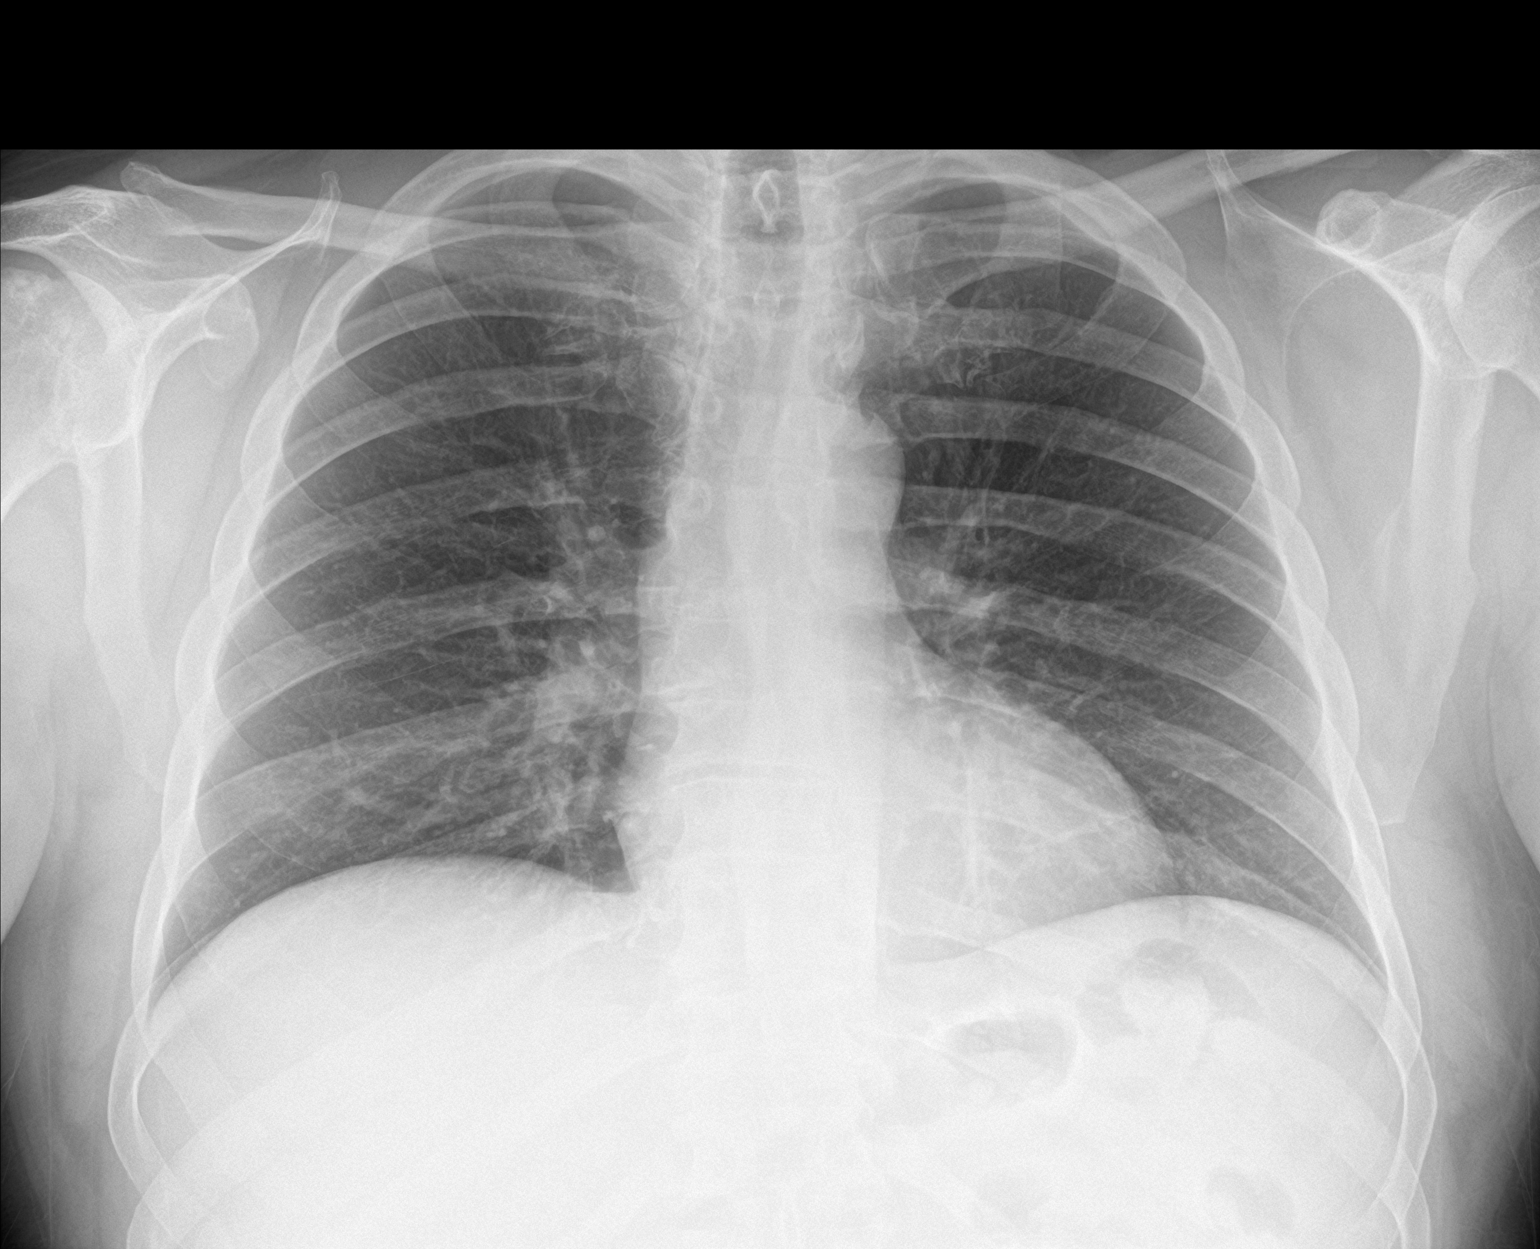

[chest lat]
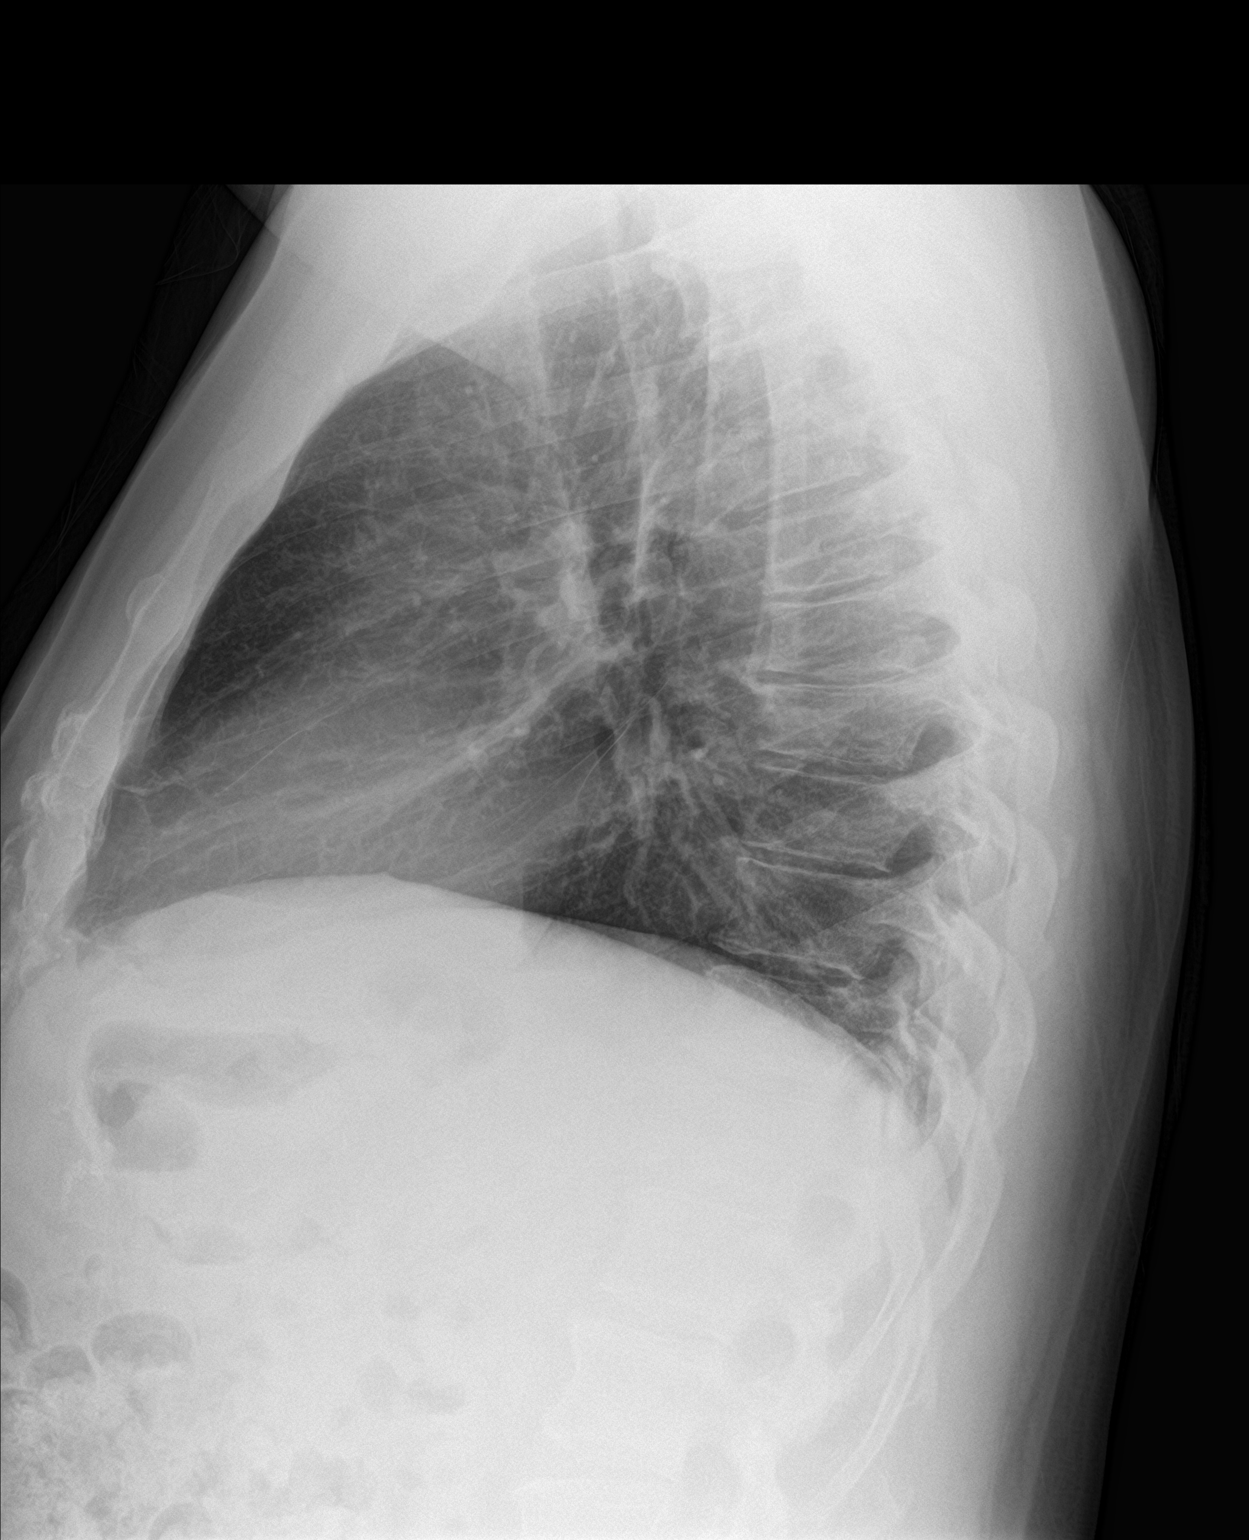

[2 of 2 positions shown; findings below may reference images not displayed]

FINDINGS: Lungs are clear. The heart size and pulmonary vascularity are
normal. No adenopathy. There is mild anterior wedging of two lower
thoracic vertebral bodies, stable.
IMPRESSION: No edema or consolidation.

## 2019-01-25 ENCOUNTER — Other Ambulatory Visit: Payer: Self-pay | Admitting: Nurse Practitioner

## 2019-01-25 DIAGNOSIS — R05 Cough: Secondary | ICD-10-CM

## 2019-01-25 DIAGNOSIS — R059 Cough, unspecified: Secondary | ICD-10-CM

## 2019-01-28 NOTE — Telephone Encounter (Signed)
Called patient and he says disregard request. Pt d/c Prilosec.

## 2019-02-12 ENCOUNTER — Telehealth: Payer: Self-pay | Admitting: Pulmonary Disease

## 2019-02-12 MED ORDER — FASENRA 30 MG/ML ~~LOC~~ SOSY: 30.0000 mg | PREFILLED_SYRINGE | SUBCUTANEOUS | 11 refills | Status: DC

## 2019-02-12 NOTE — Telephone Encounter (Signed)
Contacted [facility] to set up shipment for [facility]. Was advised that the pt's prescription expired in June, a new prescription will need to be sent in. This has been done. Will call back to set up shipment.

## 2019-02-13 NOTE — Telephone Encounter (Signed)
[name] Order: 30mg #1 prefilled syringe Ordered date: 02/13/2019  Expected date of arrival: 02/15/2019 Ordered by: Jessica Jones, CMA  Speciality Pharmacy: [facility] 

## 2019-02-15 NOTE — Telephone Encounter (Signed)
[name] Shipment Received:  30mg  #1 prefilled syringe Medication arrival date: 02/15/19 Lot #:[id number] Exp date: 04/2020 Received by: Suzi Roots

## 2019-02-20 ENCOUNTER — Ambulatory Visit (INDEPENDENT_AMBULATORY_CARE_PROVIDER_SITE_OTHER): Payer: 59

## 2019-02-20 ENCOUNTER — Other Ambulatory Visit: Payer: Self-pay

## 2019-02-20 DIAGNOSIS — J4540 Moderate persistent asthma, uncomplicated: Secondary | ICD-10-CM | POA: Diagnosis not present

## 2019-02-20 MED ORDER — BENRALIZUMAB 30 MG/ML ~~LOC~~ SOSY
30.0000 mg | PREFILLED_SYRINGE | Freq: Once | SUBCUTANEOUS | Status: AC
  Administered 2019-02-20: 30 mg via SUBCUTANEOUS

## 2019-02-20 NOTE — Progress Notes (Signed)
All questions were answered by the patient before medication was administered. Have you been hospitalized in the last 10 days? No Do you have a fever? No Do you have a cough? No Do you have a headache or sore throat? No  

## 2019-04-04 ENCOUNTER — Telehealth: Payer: Self-pay | Admitting: Pulmonary Disease

## 2019-04-04 NOTE — Telephone Encounter (Signed)
ATC [facility] to set up shipment. Received a recording stating, "We are having technical difficulties at this time, please try your call again later." 

## 2019-04-05 NOTE — Telephone Encounter (Signed)
Berna Bue Order: 30mg  #1 prefilled syringe Ordered date: 04/05/2019 Expected date of arrival: 04/09/2019 Ordered by: Desmond Dike, [place]  Specialty Pharmacy: [facility]

## 2019-04-05 NOTE — Telephone Encounter (Signed)
Mike Hayes is calling back from [employer]. CB is [phone]

## 2019-04-05 NOTE — Telephone Encounter (Signed)
Routing to injection pool to follow up on

## 2019-04-05 NOTE — Telephone Encounter (Signed)
[place]. Was placed on a 20+ min hold. Will try back.

## 2019-04-09 NOTE — Telephone Encounter (Signed)
[name] Shipment Received:  30mg  #1 prefilled syringe Medication arrival date: 04/09/19 Lot #: [id number] Exp date:  04/17/2020 Received by: Elliot Dally

## 2019-04-17 ENCOUNTER — Ambulatory Visit: Payer: 59

## 2019-04-19 ENCOUNTER — Other Ambulatory Visit: Payer: Self-pay

## 2019-04-19 ENCOUNTER — Ambulatory Visit (INDEPENDENT_AMBULATORY_CARE_PROVIDER_SITE_OTHER): Payer: 59

## 2019-04-19 DIAGNOSIS — J4540 Moderate persistent asthma, uncomplicated: Secondary | ICD-10-CM

## 2019-04-19 MED ORDER — BENRALIZUMAB 30 MG/ML ~~LOC~~ SOSY
30.0000 mg | PREFILLED_SYRINGE | Freq: Once | SUBCUTANEOUS | Status: AC
  Administered 2019-04-19: 30 mg via SUBCUTANEOUS

## 2019-04-19 NOTE — Progress Notes (Signed)
All questions were answered by the patient before medication was administered. Have you been hospitalized in the last 10 days? No Do you have a fever? No Do you have a cough? No Do you have a headache or sore throat? No  

## 2019-06-17 ENCOUNTER — Telehealth: Payer: Self-pay

## 2019-06-17 NOTE — Telephone Encounter (Signed)
Berna Bue Order: 30mg  #1 prefilled syringe Ordered date: 06/17/19  Expected date of arrival: patient must be contacted and give approval before rx can be shipped.  [place] will call us back after this has happened to schedule shipment.  Ordered by: Len Blalock, [place]  Specialty Pharmacy: [facility]

## 2019-06-21 NOTE — Telephone Encounter (Signed)
[name] not delivered at this time.  Patient scheduled for injection 06/24/19. Called Patient. Patient stated he had given permission to [place] to our office for up to 1 year. [place] and was told Berna Bue was never ordered to be shipped to office. Per notes- Berna Bue was ordered 06/17/19 and needed Patient permission for shipment.  [name] shipment scheduled 06/24/19 between 10-12 by [employer].  Patient scheduled for [name] injection 06/25/19 at 3:30pm.

## 2019-06-24 ENCOUNTER — Ambulatory Visit: Payer: 59

## 2019-06-24 NOTE — Telephone Encounter (Signed)
[name] Shipment Received:  30mg  #1 prefilled syringe Medication arrival date: 06/24/19 Lot #: [id number] Exp date: 07/18/2020 Received by: Elliot Dally

## 2019-06-25 ENCOUNTER — Other Ambulatory Visit: Payer: Self-pay

## 2019-06-25 ENCOUNTER — Ambulatory Visit (INDEPENDENT_AMBULATORY_CARE_PROVIDER_SITE_OTHER): Payer: 59

## 2019-06-25 DIAGNOSIS — J4540 Moderate persistent asthma, uncomplicated: Secondary | ICD-10-CM | POA: Diagnosis not present

## 2019-06-25 MED ORDER — BENRALIZUMAB 30 MG/ML ~~LOC~~ SOSY
30.0000 mg | PREFILLED_SYRINGE | Freq: Once | SUBCUTANEOUS | Status: AC
  Administered 2019-06-25: 30 mg via SUBCUTANEOUS

## 2019-06-25 NOTE — Progress Notes (Signed)
Have you been hospitalized within the last 10 days?  No Do you have a fever?  No Do you have a cough?  No Do you have a headache or sore throat? No  

## 2019-06-25 NOTE — Addendum Note (Signed)
Addended by: Elton Sin on: 06/25/2019 03:44 PM   Modules accepted: Orders

## 2019-08-12 ENCOUNTER — Telehealth: Payer: Self-pay | Admitting: Pulmonary Disease

## 2019-08-12 ENCOUNTER — Encounter: Payer: Self-pay | Admitting: Pulmonary Disease

## 2019-08-12 NOTE — Telephone Encounter (Signed)
Checked Cover My Meds. PA was denied >> The requested medication and/or diagnosis are not a covered benefit and excluded from coverage in accordance with the terms and conditions of your plan benefit. Therefore, the request has been administratively denied. Pt has been on this medication for some time, an appeal letter has been written and will be sent to his insurance plan. Will await appeal decision.

## 2019-08-12 NOTE — Telephone Encounter (Signed)
Called [facility] to set up pt's shipment for [place]. Was advised that the pt's PA expired on 08/08/2019. PA has been started on Cover My Meds. Key: [id number] Will follow up.

## 2019-08-15 NOTE — Telephone Encounter (Signed)
Called and spoke with pt and let him know what was going on. Advised him that I would cancel his appointment on 08/20/2019 due to not knowing when his appeal decision would be made. He was very understanding and appreciated [place] working on this. Will contact pt back to make a new appointment when we know his medication will be in the office.

## 2019-08-20 ENCOUNTER — Ambulatory Visit: Payer: 59

## 2019-08-21 ENCOUNTER — Telehealth: Payer: Self-pay | Admitting: Pulmonary Disease

## 2019-08-21 MED ORDER — FASENRA 30 MG/ML ~~LOC~~ SOSY: 30.0000 mg | PREFILLED_SYRINGE | SUBCUTANEOUS | 11 refills | Status: DC

## 2019-08-21 NOTE — Telephone Encounter (Signed)
Called [facility] to set up shipment for [facility].  [facility] was made aware of PA approval.  [name] PA approval is being processed through major medical.  PA authorization # [id number] given. [name] is being processed as immediate, so shipment should be ready to scheduled after 2pm 08/22/19, if not before. We will be contacted by [facility] if anything further is needed. Patient is aware.  Will follow up.

## 2019-08-21 NOTE — Telephone Encounter (Signed)
Returned call to [employer], spoke with [employer].  Blanchie Serve prescription given to [facility] Pharmacist.   Will follow up 08/22/19, to schedule delivery.

## 2019-08-21 NOTE — Telephone Encounter (Signed)
Dia from [facility] is wanting to get delivery details about the fasenra injection. Call back [phone]

## 2019-08-21 NOTE — Telephone Encounter (Signed)
Called [facility] to set up shipment for [name].  [facility] was made aware of PA approval.  [name] PA approval is being processed through major medical.  PA authorization # [id number] given. [name] is being processed as immediate, so shipment should be ready to scheduled after 2pm 08/22/19, if not before. We will be contacted by [facility] if anything further is needed. Patient is aware.  Will follow up.  

## 2019-08-21 NOTE — Telephone Encounter (Signed)
Returned call to [facility], spoke with Vincent.  New [name] prescription given to [facility] Pharmacist.   Will follow up 08/22/19, to schedule delivery. 

## 2019-08-22 NOTE — Telephone Encounter (Signed)
[employer] [facility], spoke with Jonny Ruiz.  Attempted to set up delivery.  Patient PA is still being processed with specialty pharmacy. [facility] needs to discuss Patient responsibility for payment and receive consent. Will follow up 08/23/19 for update.

## 2019-08-23 NOTE — Telephone Encounter (Signed)
Received call from [facility] stating patient is approved and [name] is ready to be shipped. Informed Mike Hayes is scheduled for shipment  08/27/19, and Patient is scheduled for injection 08/29/19 at 0930.

## 2019-08-23 NOTE — Telephone Encounter (Signed)
[name] Order: 30mg  #1 prefilled syringe Ordered date: 08/23/19 Expected date of arrival: 08/27/19 Ordered by: Javelle Donigan,LPN Specialty Pharmacy: 10/25/19

## 2019-08-23 NOTE — Telephone Encounter (Signed)
Called [facility] and spoke with Angelique Blonder to follow up on [name] approval.  Angelique Blonder stated [name] is being processed through major medical benefits.  Angelique Blonder stated [facility] has not made any decision at this time, and authorization number Patient received was reference number.

## 2019-08-26 ENCOUNTER — Telehealth: Payer: Self-pay | Admitting: Pulmonary Disease

## 2019-08-26 NOTE — Telephone Encounter (Signed)
Called [employer] spoke with [place].  Annice Pih stated PA's can not be completed through [facility] for [facility]. PA initiated and approved 08/26/19-08/25/20. PA authorization # [id number].  Called [facility] to follow up on shipment, spoke with Windy Fast. Windy Fast stated shipment will be pending 24-48 hours for medical review for co pay. Windy Fast is aware of 08/29/19 injection and stated [name] should be shipped in time. Will contact [facility] 08/27/19 to follow up.

## 2019-08-26 NOTE — Telephone Encounter (Signed)
Called [employer] spoke with [place].  Annice Pih stated PA's can not be completed through [facility] for [facility]. PA initiated and approved 08/26/19-08/25/20. PA authorization # [id number].

## 2019-08-27 NOTE — Telephone Encounter (Signed)
Called [facility] to follow up on Patient PA and [place] shipment, spoke with Elease Hashimoto. Patient was contacted by [facility] for 2021 consent. [name] shipment scheduled for 08/28/19 delivery.

## 2019-08-28 NOTE — Telephone Encounter (Signed)
Jill from [employer] calling to set up delivery for [facility] medication. Noreene Larsson may be reached at [phone].

## 2019-08-28 NOTE — Telephone Encounter (Signed)
[name] Shipment Received:  30mg #1 prefilled syringe Medication arrival date: 08/28/19 Lot #: [id number] Exp date: 09/15/2020 Received by: Raidon Swanner,LPN 

## 2019-08-28 NOTE — Telephone Encounter (Signed)
Called [facility], spoke with Iris.  Patient Mike Hayes was not confirmed for shipment.  [name] shipment scheduled for morning delivery 08/30/19.  Confirmation # [id number]

## 2019-08-29 ENCOUNTER — Ambulatory Visit (INDEPENDENT_AMBULATORY_CARE_PROVIDER_SITE_OTHER): Payer: 59

## 2019-08-29 ENCOUNTER — Other Ambulatory Visit: Payer: Self-pay

## 2019-08-29 DIAGNOSIS — J4540 Moderate persistent asthma, uncomplicated: Secondary | ICD-10-CM

## 2019-08-29 MED ORDER — BENRALIZUMAB 30 MG/ML ~~LOC~~ SOSY
30.0000 mg | PREFILLED_SYRINGE | Freq: Once | SUBCUTANEOUS | Status: AC
  Administered 2019-08-29: 09:00:00 30 mg via SUBCUTANEOUS

## 2019-08-29 NOTE — Progress Notes (Signed)
Have you been hospitalized within the last 10 days?  No Do you have a fever?  No Do you have a cough?  No Do you have a headache or sore throat? No  

## 2019-08-30 ENCOUNTER — Telehealth: Payer: Self-pay | Admitting: Pulmonary Disease

## 2019-08-30 NOTE — Telephone Encounter (Signed)
[name] Shipment Received:  30mg  #1 prefilled syringe Medication arrival date: 08/30/19 Lot #: [id number] Exp date: 09/15/2020 Received by: 11/15/2020

## 2019-09-19 ENCOUNTER — Other Ambulatory Visit: Payer: Self-pay | Admitting: Pulmonary Disease

## 2019-10-09 ENCOUNTER — Telehealth: Payer: Self-pay | Admitting: Pulmonary Disease

## 2019-10-09 NOTE — Telephone Encounter (Signed)
[name] Order: 30mg  #1 prefilled syringe Ordered date: 10/09/2019 Expected date of arrival: 10/11/2019 Ordered by: 10/13/2019, CMA  Specialty Pharmacy: [facility]

## 2019-10-11 NOTE — Telephone Encounter (Signed)
[name] Shipment Received:  30mg  #1 prefilled syringe Medication arrival date: 10/11/2019 Lot #: [id number] Exp date: 10/2020 Received by: 11/2020, CMA

## 2019-10-23 ENCOUNTER — Other Ambulatory Visit: Payer: Self-pay

## 2019-10-23 ENCOUNTER — Ambulatory Visit (INDEPENDENT_AMBULATORY_CARE_PROVIDER_SITE_OTHER): Payer: 59

## 2019-10-23 DIAGNOSIS — J4540 Moderate persistent asthma, uncomplicated: Secondary | ICD-10-CM

## 2019-10-23 MED ORDER — BENRALIZUMAB 30 MG/ML ~~LOC~~ SOSY
30.0000 mg | PREFILLED_SYRINGE | Freq: Once | SUBCUTANEOUS | Status: AC
  Administered 2019-10-23: 10:00:00 30 mg via SUBCUTANEOUS

## 2019-10-23 NOTE — Progress Notes (Signed)
Have you been hospitalized within the last 10 days?  No Do you have a fever?  No Do you have a cough?  No Do you have a headache or sore throat? No  

## 2019-10-30 ENCOUNTER — Telehealth: Payer: Self-pay | Admitting: Pulmonary Disease

## 2019-10-30 NOTE — Telephone Encounter (Signed)
10/30/2019  If patient is able to demonstrate competent use in front of [place] and he feels comfortable administering at home that I believe that is reasonable.  If the patient does not or if he is struggling with using the self injector pen based off of our assessment.  That we may need to file an appeal.  Elisha Headland, FNP

## 2019-10-30 NOTE — Telephone Encounter (Signed)
Please disregard message

## 2019-10-30 NOTE — Telephone Encounter (Signed)
Pt is currently receiving [name] 30mg q8w at our office. We are starting to transition our patients to self administer at home. Please advise if you believe pt would be a good candidate for this. Thanks.  

## 2019-12-26 ENCOUNTER — Other Ambulatory Visit: Payer: Self-pay

## 2019-12-26 ENCOUNTER — Ambulatory Visit (INDEPENDENT_AMBULATORY_CARE_PROVIDER_SITE_OTHER): Payer: 59

## 2019-12-26 DIAGNOSIS — J4540 Moderate persistent asthma, uncomplicated: Secondary | ICD-10-CM | POA: Diagnosis not present

## 2019-12-26 MED ORDER — BENRALIZUMAB 30 MG/ML ~~LOC~~ SOSY
30.0000 mg | PREFILLED_SYRINGE | Freq: Once | SUBCUTANEOUS | Status: AC
  Administered 2019-12-26: 30 mg via SUBCUTANEOUS

## 2019-12-26 NOTE — Progress Notes (Signed)
All questions were answered by the patient before medication was administered. Have you been hospitalized in the last 10 days? No Do you have a fever? No Do you have a cough? No Do you have a headache or sore throat? No  

## 2020-02-24 ENCOUNTER — Ambulatory Visit (INDEPENDENT_AMBULATORY_CARE_PROVIDER_SITE_OTHER): Payer: 59

## 2020-02-24 ENCOUNTER — Other Ambulatory Visit: Payer: Self-pay

## 2020-02-24 DIAGNOSIS — J4540 Moderate persistent asthma, uncomplicated: Secondary | ICD-10-CM | POA: Diagnosis not present

## 2020-02-24 LAB — HM DIABETES EYE EXAM

## 2020-02-24 MED ORDER — BENRALIZUMAB 30 MG/ML ~~LOC~~ SOSY
30.0000 mg | PREFILLED_SYRINGE | Freq: Once | SUBCUTANEOUS | Status: AC
  Administered 2020-02-24: 30 mg via SUBCUTANEOUS

## 2020-02-24 NOTE — Progress Notes (Signed)
All questions were answered by the patient before medication was administered. Have you been hospitalized in the last 10 days? No Do you have a fever? No Do you have a cough? No Do you have a headache or sore throat? No  

## 2020-04-13 ENCOUNTER — Telehealth: Payer: Self-pay | Admitting: Pulmonary Disease

## 2020-04-13 NOTE — Telephone Encounter (Signed)
[name] Order: 30mg  #1 prefilled syringe Ordered date: 04/13/20 Expected date of arrival: 04/16/20 Ordered by: Natalee Tomkiewicz,LPN Specialty Pharmacy: 04/18/20

## 2020-04-20 ENCOUNTER — Other Ambulatory Visit: Payer: Self-pay

## 2020-04-20 ENCOUNTER — Ambulatory Visit (INDEPENDENT_AMBULATORY_CARE_PROVIDER_SITE_OTHER): Payer: 59

## 2020-04-20 DIAGNOSIS — J4540 Moderate persistent asthma, uncomplicated: Secondary | ICD-10-CM | POA: Diagnosis not present

## 2020-04-20 MED ORDER — BENRALIZUMAB 30 MG/ML ~~LOC~~ SOSY
30.0000 mg | PREFILLED_SYRINGE | Freq: Once | SUBCUTANEOUS | Status: AC
  Administered 2020-04-20: 30 mg via SUBCUTANEOUS

## 2020-04-20 NOTE — Progress Notes (Signed)
Have you been hospitalized within the last 10 days?  No Do you have a fever?  No Do you have a cough?  No Do you have a headache or sore throat? No Do you have your Epi Pen visible and is it within date?  Yes 

## 2020-04-23 ENCOUNTER — Telehealth: Payer: Self-pay | Admitting: Pulmonary Disease

## 2020-04-24 NOTE — Telephone Encounter (Signed)
Returned call and was advised ICD-10 has been updated.  Discussed plan changes, rep states they received notice that [name] syringe will no longer be able to go through pharmacy benefit, but patient has auth through medical benefit. Approved through 08/25/20. She will reach out to her supervisor for clarification and will call back to advise.

## 2020-04-30 NOTE — Telephone Encounter (Signed)
Did not receive return call. Called again and was advised that patient's medical authorization was voided and patient must now receive auto-injection through pharmacy benefit.  Submitted a Prior Authorization request to [facility] for [facility] PEN via Cover My Meds. Will update once we receive a response.   Key: [id number] - PA [place] ID: [id number]

## 2020-05-01 NOTE — Telephone Encounter (Signed)
Faxed additional documentation to insurance plan to show patient had [name] approved through [facility] Medical benefit. Will update once we receive a response.

## 2020-05-01 NOTE — Telephone Encounter (Signed)
See other encounter.

## 2020-05-12 ENCOUNTER — Telehealth: Payer: Self-pay | Admitting: Pulmonary Disease

## 2020-05-12 NOTE — Telephone Encounter (Signed)
Returned call to [facility] [employer] Production designer, theatre/television/film. He states patient had trouble scheduling [name] shipment. Pharmacy asked patient for copay card details and patient did not have.  Provided pharmacy copay card details and called patient to advise, left message.  BIN- [id number] ID- [id number] PCN- [id number]

## 2020-05-13 ENCOUNTER — Telehealth: Payer: Self-pay | Admitting: Pulmonary Disease

## 2020-05-13 NOTE — Telephone Encounter (Signed)
[name] Order: 30mg  #1 prefilled syringe Ordered date: 05/13/20 Expected date of arrival: 05/15/20 Ordered by: Suman Trivedi,LPN Specialty Pharmacy: [facility]  Order # 05/17/20

## 2020-05-13 NOTE — Telephone Encounter (Signed)
Received call from [facility] [place] manager that patient's [name] rx is processing fine through the medical benefit. Nothing further needed. Closing encounter.

## 2020-05-15 NOTE — Telephone Encounter (Signed)
[name] Shipment Received:  30mg  #1 prefilled syringe Medication arrival date: 05/15/20 Lot #: [id number] Exp date: 06/16/2021 Received by: 06/18/2021

## 2020-05-22 NOTE — Telephone Encounter (Signed)
Appeal for [name] Pen has been approved. Coverage dates are 05/14/20- 05/14/21.   Auth# [id number]

## 2020-06-04 ENCOUNTER — Telehealth: Payer: Self-pay | Admitting: Pulmonary Disease

## 2020-06-05 NOTE — Telephone Encounter (Signed)
Called and spoke with Patient.  Patient stated his work schedule changed and he requested 06/16/20 at 9, for fasenra injection. Patient scheduled 06/16/2020 at 0900.

## 2020-06-15 ENCOUNTER — Ambulatory Visit: Payer: 59

## 2020-06-16 ENCOUNTER — Ambulatory Visit (INDEPENDENT_AMBULATORY_CARE_PROVIDER_SITE_OTHER): Payer: 59

## 2020-06-16 ENCOUNTER — Telehealth: Payer: Self-pay | Admitting: Pulmonary Disease

## 2020-06-16 ENCOUNTER — Other Ambulatory Visit: Payer: Self-pay

## 2020-06-16 DIAGNOSIS — J4540 Moderate persistent asthma, uncomplicated: Secondary | ICD-10-CM

## 2020-06-16 MED ORDER — BENRALIZUMAB 30 MG/ML ~~LOC~~ SOSY
30.0000 mg | PREFILLED_SYRINGE | Freq: Once | SUBCUTANEOUS | Status: AC
  Administered 2020-06-16: 30 mg via SUBCUTANEOUS

## 2020-06-16 NOTE — Progress Notes (Signed)
Have you been hospitalized within the last 10 days?  No Do you have a fever?  No Do you have a cough?  No Do you have a headache or sore throat? No Do you have your Epi Pen visible and is it within date?  No   Patient is interested in self administering medication. I let patient know I would send this request to the injection pool and the provider. Patient believes he can do it or his daughter can , however the daughter will not be able to make it to the appointment to show how to administer but patient will do instead.

## 2020-06-16 NOTE — Telephone Encounter (Signed)
LMTCB for the pt 

## 2020-06-16 NOTE — Telephone Encounter (Signed)
Patient likely would be a good candidate for this.  Unfortunately before transitioning to at home dosing he will need to have a an office visit in a 30-minute time slot with a new pulmonologist.  This should not be an APP.  Former patient of Dr. Kendrick Fries.  Would recommend: Dr. Judeth Horn, Dr. Francine Graven, Dr. Everardo All, or Dr. Celine Mans.  Mike Headland, FNP

## 2020-06-16 NOTE — Telephone Encounter (Signed)
Patient is interested in self administering medication for [name] injection. I let patient know I would send this request to the injection pool and the provider. Patient believes he can do it or his daughter can , however the daughter will not be able to make it to the appointment to show how to administer but patient will do instead.  Mike Hayes please advise as this patient was last seen by MD Dr. Kendrick Fries.

## 2020-07-01 ENCOUNTER — Telehealth: Payer: Self-pay | Admitting: Pulmonary Disease

## 2020-07-02 NOTE — Telephone Encounter (Signed)
[name] Order: 30mg  #1 prefilled syringe Ordered date: 07/02/20 Expected date of arrival: 07/07/20 Ordered by: Jazlene Bares,LPN Specialty Pharmacy: [facility]

## 2020-07-07 NOTE — Telephone Encounter (Signed)
[name] Shipment Received:  30mg #1 prefilled syringe Medication arrival date: 07/07/20 Lot #: [id number] Exp date: 08/17/2021 Received by: Deamonte Sayegh,LPN 

## 2020-08-10 ENCOUNTER — Telehealth: Payer: Self-pay | Admitting: Pulmonary Disease

## 2020-08-11 ENCOUNTER — Other Ambulatory Visit: Payer: Self-pay

## 2020-08-11 ENCOUNTER — Ambulatory Visit (INDEPENDENT_AMBULATORY_CARE_PROVIDER_SITE_OTHER): Payer: 59

## 2020-08-11 DIAGNOSIS — J4540 Moderate persistent asthma, uncomplicated: Secondary | ICD-10-CM

## 2020-08-11 MED ORDER — BENRALIZUMAB 30 MG/ML ~~LOC~~ SOSY
30.0000 mg | PREFILLED_SYRINGE | Freq: Once | SUBCUTANEOUS | Status: AC
  Administered 2020-08-11: 30 mg via SUBCUTANEOUS

## 2020-08-11 NOTE — Progress Notes (Signed)
Have you been hospitalized within the last 10 days?  No Do you have a fever?  No Do you have a cough?  No Do you have a headache or sore throat? No  

## 2020-08-12 NOTE — Telephone Encounter (Signed)
Elsa from [employer] to set up delivery of [name]. Elsa phone number is [phone].

## 2020-08-14 NOTE — Telephone Encounter (Signed)
Called [facility] and was told someone would call next week to schedule [name] shipment. Patient just recently received [name] injection and is not scheduled for 8 weeks, until 10/06/20 for next injection.

## 2020-08-25 ENCOUNTER — Other Ambulatory Visit: Payer: Self-pay | Admitting: Pulmonary Disease

## 2020-09-22 ENCOUNTER — Telehealth: Payer: Self-pay | Admitting: Pharmacist

## 2020-09-22 MED ORDER — FASENRA PEN 30 MG/ML ~~LOC~~ SOAJ: 30.0000 mg | SUBCUTANEOUS | 0 refills | Status: DC

## 2020-09-22 NOTE — Telephone Encounter (Signed)
Called patient regarding Mike Hayes transitioning to home admin. Left VM requesting return call.  Need to advise that he can bring family member if caregiver/family member will be administering. Rx for [name] pre-filled pen autoinjector sent to [facility] for one fill only.  Patient's next dose is due 10/06/20. [facility] for [name] pen.  Chesley Mires, PharmD, MPH Clinical Pharmacist (Rheumatology and Pulmonology)

## 2020-09-23 NOTE — Telephone Encounter (Signed)
Noted! Thank you

## 2020-09-29 ENCOUNTER — Telehealth: Payer: Self-pay | Admitting: Primary Care

## 2020-09-29 NOTE — Telephone Encounter (Signed)
Noted. Will route message to pharmacy.

## 2020-09-29 NOTE — Telephone Encounter (Signed)
Patient has OV with Buelah Manis on 10/06/20 @ 2pm. Will tentatively schedule him at 2:20pm on pharmacy schedule for next [name] injection. Advised patient that he may bring family member who will be administering. Patient moved onto pharmacy calendar

## 2020-09-29 NOTE — Telephone Encounter (Signed)
Conferenced called [facility] and [facility] and got patient's copay card processed. Patient has a zero copay. Set up shipment to clinic for 10/01/20.  Called patient and advised.

## 2020-10-02 NOTE — Telephone Encounter (Signed)
Medication has been delivered to clinic

## 2020-10-06 ENCOUNTER — Ambulatory Visit: Payer: 59 | Admitting: Pharmacist

## 2020-10-06 ENCOUNTER — Other Ambulatory Visit: Payer: Self-pay

## 2020-10-06 ENCOUNTER — Encounter: Payer: Self-pay | Admitting: Primary Care

## 2020-10-06 ENCOUNTER — Ambulatory Visit: Payer: 59 | Admitting: Primary Care

## 2020-10-06 DIAGNOSIS — J4540 Moderate persistent asthma, uncomplicated: Secondary | ICD-10-CM

## 2020-10-06 MED ORDER — FASENRA PEN 30 MG/ML ~~LOC~~ SOAJ: 30.0000 mg | SUBCUTANEOUS | 3 refills | Status: DC

## 2020-10-06 MED ORDER — VENTOLIN HFA 108 (90 BASE) MCG/ACT IN AERS
INHALATION_SPRAY | RESPIRATORY_TRACT | 1 refills | Status: DC
Start: 2020-10-06 — End: 2022-09-19

## 2020-10-06 NOTE — Patient Instructions (Addendum)
Your next [name] is due 12/01/20 and every 8 weeks thereafter  Your prescription will come from [facility]. Their phone number is: [phone]  Remember the 5 C's:  COUNTER - leave on the counter at least 30 minutes but up to overnight to bring medication to room temperature. This may help prevent stinging  COLD - place something cold (like an ice gel pack or cold water bottle) on the injection site just before cleansing with alcohol. This may help reduce pain  CLARITIN - use Claritin (generic name is loratadine) for the first two weeks of treatment or the day of, the day before, and the day after injecting. This will help to minimize injection site reactions  CORTISONE CREAM - apply if injection site is irritated and itching  CALL ME - if injection site reaction is bigger than the size of your fist, looks infected, blisters, or if you develop hives

## 2020-10-06 NOTE — Patient Instructions (Addendum)
Nice meeting you today Mr. Scheffler, no changes today   Recommendations - Continue [name] injections (call if any issues with insurance) - Refilled albuterol  Follow-up: - 1 year with APP or new [facility] pulmonary MD    Asthma, Adult  Asthma is a long-term (chronic) condition that causes recurrent episodes in which the airways become tight and narrow. The airways are the passages that lead from the nose and mouth down into the lungs. Asthma episodes, also called asthma attacks, can cause coughing, wheezing, shortness of breath, and chest pain. The airways can also fill with mucus. During an attack, it can be difficult to breathe. Asthma attacks can range from minor to life threatening. Asthma cannot be cured, but medicines and lifestyle changes can help control it and treat acute attacks. What are the causes? This condition is believed to be caused by inherited (genetic) and environmental factors, but its exact cause is not known. There are many things that can bring on an asthma attack or make asthma symptoms worse (triggers). Asthma triggers are different for each person. Common triggers include:  Mold.  Dust.  Cigarette smoke.  Cockroaches.  Things that can cause allergy symptoms (allergens), such as animal dander or pollen from trees or grass.  Air pollutants such as household cleaners, wood smoke, smog, or Therapist, occupationalchemical odors.  Cold air, weather changes, and winds (which increase molds and pollen in the air).  Strong emotional expressions such as crying or laughing hard.  Stress.  Certain medicines (such as aspirin) or types of medicines (such as beta-blockers).  Sulfites in foods and drinks. Foods and drinks that may contain sulfites include dried fruit, potato chips, and sparkling grape juice.  Infections or inflammatory conditions such as the flu, a cold, or inflammation of the nasal membranes (rhinitis).  Gastroesophageal reflux disease (GERD).  Exercise or strenuous  activity. What are the signs or symptoms? Symptoms of this condition may occur right after asthma is triggered or many hours later. Symptoms include:  Wheezing. This can sound like whistling when you breathe.  Excessive nighttime or early morning coughing.  Frequent or severe coughing with a common cold.  Chest tightness.  Shortness of breath.  Tiredness (fatigue) with minimal activity. How is this diagnosed? This condition is diagnosed based on:  Your medical history.  A physical exam.  Tests, which may include: ? Lung function studies and pulmonary studies (spirometry). These tests can evaluate the flow of air in your lungs. ? Allergy tests. ? Imaging tests, such as X-rays. How is this treated? There is no cure for this condition, but treatment can help control your symptoms. Treatment for asthma usually involves:  Identifying and avoiding your asthma triggers.  Using medicines to control your symptoms. Generally, two types of medicines are used to treat asthma: ? Controller medicines. These help prevent asthma symptoms from occurring. They are usually taken every day. ? Fast-acting reliever or rescue medicines. These quickly relieve asthma symptoms by widening the narrow and tight airways. They are used as needed and provide short-term relief.  Using supplemental oxygen. This may be needed during a severe episode.  Using other medicines, such as: ? Allergy medicines, such as antihistamines, if your asthma attacks are triggered by allergens. ? Immune medicines (immunomodulators). These are medicines that help control the immune system.  Creating an asthma action plan. An asthma action plan is a written plan for managing and treating your asthma attacks. This plan includes: ? A list of your asthma triggers and how to avoid  them. ? Information about when medicines should be taken and when their dosage should be changed. ? Instructions about using a device called a peak  flow meter. A peak flow meter measures how well the lungs are working and the severity of your asthma. It helps you monitor your condition. Follow these instructions at home: Controlling your home environment Control your home environment in the following ways to help avoid triggers and prevent asthma attacks:  Change your heating and air conditioning filter regularly.  Limit your use of fireplaces and wood stoves.  Get rid of pests (such as roaches and mice) and their droppings.  Throw away plants if you see mold on them.  Clean floors and dust surfaces regularly. Use unscented cleaning products.  Try to have someone else vacuum for you regularly. Stay out of rooms while they are being vacuumed and for a short while afterward. If you vacuum, use a dust mask from a hardware store, a double-layered or microfilter vacuum cleaner bag, or a vacuum cleaner with a HEPA filter.  Replace carpet with wood, tile, or vinyl flooring. Carpet can trap dander and dust.  Use allergy-proof pillows, mattress covers, and box spring covers.  Keep your bedroom a trigger-free room.  Avoid pets and keep windows closed when allergens are in the air.  Wash beddings every week in hot water and dry them in a dryer.  Use blankets that are made of polyester or cotton.  Clean bathrooms and kitchens with bleach. If possible, have someone repaint the walls in these rooms with mold-resistant paint. Stay out of the rooms that are being cleaned and painted.  Wash your hands often with soap and water. If soap and water are not available, use hand sanitizer.  Do not allow anyone to smoke in your home. General instructions  Take over-the-counter and prescription medicines only as told by your health care provider. ? Speak with your health care provider if you have questions about how or when to take the medicines. ? Make note if you are requiring more frequent dosages.  Do not use any products that contain  nicotine or tobacco, such as cigarettes and e-cigarettes. If you need help quitting, ask your health care provider. Also, avoid being exposed to secondhand smoke.  Use a peak flow meter as told by your health care provider. Record and keep track of the readings.  Understand and use the asthma action plan to help minimize, or stop an asthma attack, without needing to seek medical care.  Make sure you stay up to date on your yearly vaccinations as told by your health care provider. This may include vaccines for the flu and pneumonia.  Avoid outdoor activities when allergen counts are high and when air quality is low.  Wear a ski mask that covers your nose and mouth during outdoor winter activities. Exercise indoors on cold days if you can.  Warm up before exercising, and take time for a cool-down period after exercise.  Keep all follow-up visits as told by your health care provider. This is important. Where to find more information  For information about asthma, turn to the Centers for Disease Control and Prevention at http://www.mills-berg.com/  For air quality information, turn to AirNow at http://hood.com/ Contact a health care provider if:  You have wheezing, shortness of breath, or a cough even while you are taking medicine to prevent attacks.  The mucus you cough up (sputum) is thicker than usual.  Your sputum changes from clear or white to  yellow, green, gray, or bloody.  Your medicines are causing side effects, such as a rash, itching, swelling, or trouble breathing.  You need to use a reliever medicine more than 2-3 times a week.  Your peak flow reading is still at 50-79% of your personal best after following your action plan for 1 hour.  You have a fever. Get help right away if:  You are getting worse and do not respond to treatment during an asthma attack.  You are short of breath when at rest or when doing very little physical activity.  You have difficulty eating, drinking,  or talking.  You have chest pain or tightness.  You develop a fast heartbeat or palpitations.  You have a bluish color to your lips or fingernails.  You are light-headed or dizzy, or you faint.  Your peak flow reading is less than 50% of your personal best.  You feel too tired to breathe normally. Summary  Asthma is a long-term (chronic) condition that causes recurrent episodes in which the airways become tight and narrow. These episodes can cause coughing, wheezing, shortness of breath, and chest pain.  Asthma cannot be cured, but medicines and lifestyle changes can help control it and treat acute attacks.  Make sure you understand how to avoid triggers and how and when to use your medicines.  Asthma attacks can range from minor to life threatening. Get help right away if you have an asthma attack and do not respond to treatment with your usual rescue medicines. This information is not intended to replace advice given to you by your health care provider. Make sure you discuss any questions you have with your health care provider. Document Revised: 04/03/2020 Document Reviewed: 11/06/2019 Elsevier Patient Education  2021 Elsevier Inc.     Benralizumab injection What is this medicine? BENRALIZUMAB (BEN ra LIZ oo mab) is used to help treat severe asthma. It should be used in combination with other asthma treatments. This medicine may be used for other purposes; ask your health care provider or pharmacist if you have questions. COMMON BRAND NAME(S): [name] What should I tell my health care provider before I take this medicine? They need to know if you have any of these conditions:  parasitic (helminth) infection  an unusual or allergic reaction to benralizumab, hamster proteins, other medicines, foods, dyes, or preservatives  pregnant or trying to get pregnant  breast-feeding How should I use this medicine? This medicine is for injection under the skin. It may be  administered by a healthcare professional in a hospital or clinic setting or at home. If you get this medicine at home, you will be taught how to prepare and give this medicine. Use exactly as directed. Take your medicine at regular intervals. Do not take your medicine more often than directed. It is important that you put your used injectors, needles and syringes in a special sharps container. Do not put them in a trash can. If you do not have a sharps container, call your pharmacist or healthcare provider to get one. Talk to your pediatrician regarding the use of this medicine in children. While this drug may be prescribed for children as young as 12 years for selected conditions, precautions do apply. Overdosage: If you think you have taken too much of this medicine contact a poison control center or emergency room at once. NOTE: This medicine is only for you. Do not share this medicine with others. What if I miss a dose? It is important not to  miss your dose. Call your doctor of health care professional if you are unable to keep an appointment. If you give yourself the medicine and you miss a dose, call your doctor or health care professional for advice. What may interact with this medicine? Interactions are not expected. This list may not describe all possible interactions. Give your health care provider a list of all the medicines, herbs, non-prescription drugs, or dietary supplements you use. Also tell them if you smoke, drink alcohol, or use illegal drugs. Some items may interact with your medicine. What should I watch for while using this medicine? Visit your healthcare professional for regular checks on your progress. NEVER use this medicine for an acute asthma attack. Tell your healthcare professional if your symptoms do not start to get better or if they get worse. Do not stop taking your other asthma medicines unless instructed to do so by your doctor or health care professional. What side  effects may I notice from receiving this medicine? Side effects that you should report to your doctor or health care professional as soon as possible:  allergic reactions like skin rash, itching or hives, swelling of the face, lips, or tongue  breathing problems  signs and symptoms of low blood pressure like dizziness; feeling faint or lightheaded, falls  signs and symptoms of infection like fever or chills; cough; sore throat Side effects that usually do not require medical attention (report these to your doctor or health care professional if they continue or are bothersome):  headache  pain, redness, or irritation at the site where injected This list may not describe all possible side effects. Call your doctor for medical advice about side effects. You may report side effects to FDA at 1-800-FDA-1088. Where should I keep my medicine? Keep out of the reach of children. Store unopened syringes or injectors in a refrigerator between 2 to 8 degrees C (36 to 46 degrees F). Keep in the original container until ready for use. Protect from light. Do not freeze. Do not shake. Prior to use, remove the syringe or injector from the refrigerator and use after 30 minutes at room temperature. Throw away any unused medicine after the expiration date on the label. If needed, the unopened syringe or injector may be stored at room temperature up to 25 degrees C (77 degrees F) for a maximum of 14 days. Once removed from the refrigerator and brought to room temperature, the syringe or autoinjector must be used within 14 days or discarded. NOTE: This sheet is a summary. It may not cover all possible information. If you have questions about this medicine, talk to your doctor, pharmacist, or health care provider.  2021 Elsevier/Gold Standard (2018-04-26 12:50:56)

## 2020-10-06 NOTE — Progress Notes (Signed)
HPI Patient presents today to [facility] to see pharmacy team for transitioning to self-administration of [name].  Past medical history includes moderate-severe asthma, allergic rhinitis, hypertension, T2DM, and gastric sleeve surgery.  Has not needed to use rescue inhaler for asthma since starting [name].  Patient seen by Ames Dura, NP, today. No changes were made.  Respiratory Medications Current: does not take any maintenance inhaler Tried in past: Dulera, Asmanex  OBJECTIVE Allergies  Allergen Reactions  . Exenatide Other (See Comments)    Got knots and rash.    Outpatient Encounter Medications as of 10/06/2020  Medication Sig  . Benralizumab ([name] PEN) 30 MG/ML SOAJ Inject 30 mg into the skin every 8 (eight) weeks. Deliver to MD office for clinic admin: [address] [place], [address], [place], [place] [address]  . carvedilol (COREG) 12.5 MG tablet Take 12.5 mg by mouth 2 (two) times daily.  . Cholecalciferol (VITAMIN D) 1000 UNITS capsule Take 1,000 Units by mouth daily.   . dapagliflozin propanediol (FARXIGA) 5 MG TABS tablet Take 5 mg by mouth daily. (Patient not taking: Reported on 10/06/2020)  . furosemide (LASIX) 20 MG tablet Take 20 mg by mouth daily.  Marland Kitchen glimepiride (AMARYL) 1 MG tablet Take 1 tablet by mouth 2 (two) times daily.   Marland Kitchen losartan (COZAAR) 100 MG tablet Take 100 mg by mouth daily.  . metFORMIN (GLUCOPHAGE) 1000 MG tablet Take 1,000 mg by mouth 2 (two) times daily.  . Multiple Vitamin (MULTIVITAMIN WITH MINERALS) TABS tablet Take 1 tablet by mouth daily.  Marland Kitchen terbinafine (LAMISIL) 250 MG tablet Take 250 mg by mouth daily as needed.  . VENTOLIN HFA 108 (90 Base) MCG/ACT inhaler INHALE 2 PUFFS BY MOUTH EVERY 4 TO 6 HOURS AS NEEDED   No facility-administered encounter medications on file as of 10/06/2020.     Immunization History  Administered Date(s) Administered  . Tdap 02/26/2016     PFTs PFT Results Latest Ref Rng & Units 09/24/2015  FVC-Pre L  4.68  FVC-Predicted Pre % 93  FVC-Post L 4.82  FVC-Predicted Post % 96  Pre FEV1/FVC % % 77  Post FEV1/FCV % % 78  FEV1-Pre L 3.59  FEV1-Predicted Pre % 92  FEV1-Post L 3.78  DLCO uncorrected ml/min/mmHg 44.27  DLCO UNC% % 139  DLCO corrected ml/min/mmHg 42.67  DLCO COR %Predicted % 134  DLVA Predicted % 143  TLC L 7.10  TLC % Predicted % 103  RV % Predicted % 71     Eosinophils Most recent blood eosinophil count was 800 cells/microL taken on 10/17/2017.   IgE: 629kU/L on 10/17/2017. Allergy testing was positive to many allergens (dust, mold, dog dander, grass)   Assessment   1. Biologics training (Benralizumab [name]) o MOA: A humanized afucosylated, monoclonal antibody (IgG1, kappa) that binds to the alpha subunit of the interleukin-5 receptor. IL-5 is the major cytokine responsible for the growth and differentiation, recruitment, activation, and survival of eosinophils (a cell type associated with inflammation and an important component in the pathogenesis of asthma). o Response to therapy: ??? o Side effects: antibody development (13%), headache (8%), pharyngitis (5%), injection site reaction (2.2%) o Dosing: 30 mg every 4 weeks for the first 3 doses, followed by once every 8 weeks thereafter. o Administration:  1. Administer SubQ into the upper arm, thigh, or abdomen. 2. Discussed 5C's of injection site reaction.  3. Solution is clear to opalescent, colorless to slightly yellow, and may contain a few translucent or white to off-white particles; do  not use if cloudy, discolored, or containing large particles  Patient able to self-administer in left abdomen confidently and successfully.  NDC: [address]-24 Lot: [id number] Exp: 07/2021  2. Medication Reconciliation  A drug regimen assessment was performed, including review of allergies, interactions, disease-state management, dosing and immunization history. Medications were reviewed with the patient, including name,  instructions, indication, goals of therapy, potential side effects, importance of adherence, and safe use.  Drug interaction(s): none that are clinically significant  PLAN - Continue [name] 30mg  every 8 weeks. Next dose is due 12/01/20. Prescription sent to [facility]. Patient provided with pharmacy phone number. - Patient does not take maintenance inhaler. Discussed that [facility] is add-on medication. Reports he has not taken maintenance inhalers since starting [name].  All questions encouraged and answered.  Instructed patient to reach out with any further questions or concerns.  Thank you for allowing pharmacy to participate in this patient's care.  [name], PharmD, MPH Clinical Pharmacist (Rheumatology and Pulmonology)

## 2020-10-06 NOTE — Assessment & Plan Note (Addendum)
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 RAST significantly positive. IgE 700, +Eos. Started biologic therapy in August 2019. Currently well controlled on [name] 30mg  q8 weeks and prn albuterol. No recent exacerbations. No SABA use. ACT score 25. FU in 1 year

## 2020-10-06 NOTE — Progress Notes (Signed)
@Patient  ID: Mike Hayes, male    DOB: May 05, 1966, 55 y.o.   MRN: [id number]  Chief Complaint  Patient presents with  . Follow-up    No complaints    Referring provider: No ref. provider found  HPI: 55 year old male, never smoked. PMH significant for moderate-severe persistent asthma, allergic rhinitis, high blood pressure, diabetes mellitus, gastric sleeve. Former patient of Dr. 57, last seen by pulmonary NP in May 2020. Started biologic in Spring 2019. Maintained on prn Ventolin hfa and [name] 30mg  Q8 weeks.   10/06/2020 Patient presents today for overdue annual follow-up. Since starting [name] his asthma symptoms have been well controlled. He has not needed to use his Albuterol inhaler in the past 3 years. He occasionally needs to take sudafed when working outside in the summer. His daughter was recently apart of a clinical trial for EOE using biologics. ACT 25. Denies cough, shortness of breath, chest tightness or wheezing.   Tests:  >>RAST +++positive , IgE >700, +++eosinophils  08/04/15  10/17/17 - CBC with diff - eosinophils absolute 0.8, eosinophils relative 12.3, 10/17/17 - RAST panel-multiple elevations, significant for fungus, dust mites, cat, dog, Box Elder, trees, pecans, multiple grass foods, IgE 629  PFT 09/2015 PFTs > Ratio 78%, FEV1 3.78 L (78%, 5% change), FVC 4.82L (99% pred), TLC 7.0 L (103% pred), DLCO 44.6 (139% pred)  Allergies  Allergen Reactions  . Exenatide Other (See Comments)    Got knots and rash.    Immunization History  Administered Date(s) Administered  . Tdap 02/26/2016    Past Medical History:  Diagnosis Date  . Childhood asthma   . Diabetes mellitus   . High blood pressure   . Seasonal allergies   . Sinus trouble     Tobacco History: Social History   Tobacco Use  Smoking Status Never Smoker  Smokeless Tobacco Never Used   Counseling given: Not Answered   Outpatient Medications Prior to Visit  Medication Sig Dispense  Refill  . Benralizumab ([name] PEN) 30 MG/ML SOAJ Inject 30 mg into the skin every 8 (eight) weeks. Deliver to MD office for clinic admin: [address], [place], [place] 0.28 mL 0  . carvedilol (COREG) 12.5 MG tablet Take 12.5 mg by mouth 2 (two) times daily.    . Cholecalciferol (VITAMIN D) 1000 UNITS capsule Take 1,000 Units by mouth daily.     . furosemide (LASIX) 20 MG tablet Take 20 mg by mouth daily.    [place] glimepiride (AMARYL) 1 MG tablet Take 1 tablet by mouth 2 (two) times daily.     [address]009 losartan (COZAAR) 100 MG tablet Take 100 mg by mouth daily.    . metFORMIN (GLUCOPHAGE) 1000 MG tablet Take 1,000 mg by mouth 2 (two) times daily.    . Multiple Vitamin (MULTIVITAMIN WITH MINERALS) TABS tablet Take 1 tablet by mouth daily.    Marland Kitchen terbinafine (LAMISIL) 250 MG tablet Take 250 mg by mouth daily as needed.    . VENTOLIN HFA 108 (90 Base) MCG/ACT inhaler INHALE 2 PUFFS BY MOUTH EVERY 4 TO 6 HOURS AS NEEDED 18 g 0  . dapagliflozin propanediol (FARXIGA) 5 MG TABS tablet Take 5 mg by mouth daily. (Patient not taking: Reported on 10/06/2020)     No facility-administered medications prior to visit.    Review of Systems  Review of Systems  Constitutional: Negative.   HENT: Negative.   Respiratory: Negative.    Physical Exam  BP 140/84 (BP Location:  Left Arm, Cuff Size: Normal)   Pulse 79   Temp 98 F (36.7 C)   Ht 5\' 10"  (1.778 m)   Wt 214 lb (97.1 kg)   SpO2 99%   BMI 30.71 kg/m  Physical Exam Constitutional:      Appearance: Normal appearance.  HENT:     Right Ear: Tympanic membrane normal.     Left Ear: Tympanic membrane normal.     Mouth/Throat:     Mouth: Mucous membranes are moist.     Pharynx: Oropharynx is clear.  Cardiovascular:     Rate and Rhythm: Normal rate and regular rhythm.  Pulmonary:     Effort: Pulmonary effort is normal.     Breath sounds: Normal breath sounds.  Skin:    General: Skin is warm and dry.  Neurological:     General: No  focal deficit present.     Mental Status: He is alert and oriented to person, place, and time. Mental status is at baseline.  Psychiatric:        Mood and Affect: Mood normal.        Behavior: Behavior normal.        Thought Content: Thought content normal.        Judgment: Judgment normal.      Lab Results:  CBC    Component Value Date/Time   WBC 6.9 10/17/2017 1446   RBC 5.64 10/17/2017 1446   HGB 16.3 10/17/2017 1446   HCT 47.5 10/17/2017 1446   PLT 133.0 (L) 10/17/2017 1446   MCV 84.3 10/17/2017 1446   MCHC 34.2 10/17/2017 1446   RDW 13.5 10/17/2017 1446   LYMPHSABS 2.1 10/17/2017 1446   MONOABS 0.5 10/17/2017 1446   EOSABS 0.8 (H) 10/17/2017 1446   BASOSABS 0.1 10/17/2017 1446    BMET No results found for: NA, K, CL, CO2, GLUCOSE, BUN, CREATININE, CALCIUM, GFRNONAA, GFRAA  BNP No results found for: BNP  ProBNP No results found for: PROBNP  Imaging: No results found.   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 RAST significantly positive. IgE 700, +Eos. Started biologic therapy in August 2019. Currently well controlled on [name] 30mg  q8 weeks and prn albuterol. No recent exacerbations. No SABA use. ACT score 25. FU in 1 year     September 2019, NP 10/06/2020

## 2021-01-22 ENCOUNTER — Telehealth: Payer: Self-pay | Admitting: Primary Care

## 2021-01-22 NOTE — Telephone Encounter (Signed)
ATC return call x 2. Does not ring to [facility] VM. Will close encounter

## 2021-03-03 ENCOUNTER — Ambulatory Visit: Payer: 59 | Admitting: Family Medicine

## 2021-04-06 ENCOUNTER — Encounter: Payer: Self-pay | Admitting: Family Medicine

## 2021-04-06 ENCOUNTER — Ambulatory Visit: Payer: 59 | Admitting: Family Medicine

## 2021-04-06 ENCOUNTER — Other Ambulatory Visit: Payer: Self-pay

## 2021-04-06 VITALS — BP 140/80 | HR 74 | Temp 98.2°F | Resp 18 | Ht 69.5 in | Wt 207.8 lb

## 2021-04-06 DIAGNOSIS — Z9884 Bariatric surgery status: Secondary | ICD-10-CM

## 2021-04-06 DIAGNOSIS — E663 Overweight: Secondary | ICD-10-CM

## 2021-04-06 DIAGNOSIS — E119 Type 2 diabetes mellitus without complications: Secondary | ICD-10-CM

## 2021-04-06 DIAGNOSIS — I10 Essential (primary) hypertension: Secondary | ICD-10-CM

## 2021-04-06 DIAGNOSIS — F4320 Adjustment disorder, unspecified: Secondary | ICD-10-CM | POA: Insufficient documentation

## 2021-04-06 DIAGNOSIS — F4321 Adjustment disorder with depressed mood: Secondary | ICD-10-CM

## 2021-04-06 NOTE — Patient Instructions (Signed)
Follow up in 6 months for your complete physical (today was a get established, get our ducks in a row, make sure everything was up to date visit) No need for lab work today bc it all looks fantastic! When you have your eye exam, please have them send me the report Keep up the good work on healthy diet and regular physical activity- you're doing great! Call with any questions or concerns Stay Safe!  Stay Healthy! Welcome!  We're glad to have you!

## 2021-04-06 NOTE — Progress Notes (Signed)
Subjective:    Patient ID: Mike Hayes, male    DOB: June 18, 1966, 55 y.o.   MRN: [id number]  HPI New to establish.  [facility]  DM- chronic problem, following w/ Dr Joellyn Quails.  A1C 6.5% on 04/05/21, microalbumin negative on 12/02/20.  Foot exam done 5/18 and WNL.  Currently on Glimepiride 1mg  BID, Metformin 1000mg  BID, Rybelsus 7mg  daily.  Has eye exam scheduled for 10/17 at [facility].  HTN- chronic problem, on Carvedilol 12.5mg  BID and Losartan 100mg  daily.  Pt reports BP is typically 140/80.  No CP, SOB, HAs, visual changes, edema.  Obesity- BMI is currently 30.25  s/p gastric bypass in 2014.  Was previously 267 lbs and has been steady between 200-210 lbs.  Adjustment disorder- dad is dying of cancer. 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  Health maintenance- colonoscopy done at age 37 at [facility], UTD on Tdap, Pneumovax.  Declines flu shot.  UTD on COVID- has had 2 J&J vaccines   Review of Systems For ROS see HPI   This visit occurred during the SARS-CoV-2 public health emergency.  Safety protocols were in place, including screening questions prior to the visit, additional usage of staff PPE, and extensive cleaning of exam room while observing appropriate contact time as indicated for disinfecting solutions.      Objective:   Physical Exam Vitals reviewed.  Constitutional:      General: He is not in acute distress.    Appearance: Normal appearance. He is well-developed. He is not ill-appearing.  HENT:     Head: Normocephalic and atraumatic.     Right Ear: Tympanic membrane and ear canal normal.     Left Ear: Tympanic membrane and ear canal normal.  Eyes:     Extraocular Movements: Extraocular movements intact.     Conjunctiva/sclera: Conjunctivae normal.     Pupils: Pupils are equal, round, and reactive to light.  Neck:     Thyroid: No thyromegaly.  Cardiovascular:     Rate and Rhythm: Normal rate  and regular rhythm.     Pulses: Normal pulses.     Heart sounds: Normal heart sounds. No murmur heard. Pulmonary:     Effort: Pulmonary effort is normal. No respiratory distress.     Breath sounds: Normal breath sounds.  Abdominal:     General: Bowel sounds are normal. There is no distension.     Palpations: Abdomen is soft.  Musculoskeletal:     Cervical back: Normal range of motion and neck supple.     Right lower leg: No edema.     Left lower leg: No edema.  Lymphadenopathy:     Cervical: No cervical adenopathy.  Skin:    General: Skin is warm and dry.  Neurological:     General: No focal deficit present.     Mental Status: He is alert and oriented to person, place, and time.     Cranial Nerves: No cranial nerve deficit.  Psychiatric:        Mood and Affect: Mood normal.        Behavior: Behavior normal.          Assessment & Plan:   Adjustment disorder- dad is dying of cancer and family is having a very hard time- particularly pt's sister.  He is taking dad to his appts, maintaining dad's home and yard, and continues to work 2 jobs.  Pt is exhausted but feels that  mood is ok.  Will continue to follow.

## 2021-04-07 NOTE — Assessment & Plan Note (Signed)
New to provider, ongoing for pt.  Currently following w/ Dr Joellyn Quails (Endo).  A1C well controlled at 6.5%, urine microalbumin negative (and on ARB).  Foot exam UTD and eye exam scheduled for 10/17.  Will continue to follow along and assist as able/needed.

## 2021-04-07 NOTE — Assessment & Plan Note (Signed)
Ongoing issue for pt.  S/p gastric bypass in 2014.  Was previously 267 but now holds steady between 200-210.  Encouraged healthy diet and regular exercise.  Will follow.

## 2021-04-07 NOTE — Assessment & Plan Note (Signed)
Just recently started on B12 by Endo due to hx of bariatric surgery.

## 2021-04-07 NOTE — Assessment & Plan Note (Signed)
New to provider, ongoing for pt.  On Coreg 12.5mg  BID and Losartan 100mg  daily.  Pt reports BP is typically 140/80 on these medications and this is 'good' for him.  Currently asymptomatic.  In the future, may want to adjust meds for better BP control but according to previous notes, he has been resistant to med changes in the past.  Will follow.

## 2021-04-22 ENCOUNTER — Telehealth: Payer: Self-pay | Admitting: Primary Care

## 2021-04-22 NOTE — Telephone Encounter (Signed)
Will forward over to pharmacy to follow up on.

## 2021-04-23 NOTE — Telephone Encounter (Signed)
Urgent appeal submitted to [facility]. Expected turnaround time is 72 hours. Called patient and advised.

## 2021-04-23 NOTE — Telephone Encounter (Signed)
Denial was received. Pharmacy team will submit urgent appeal today.

## 2021-04-30 ENCOUNTER — Other Ambulatory Visit (HOSPITAL_COMMUNITY): Payer: Self-pay

## 2021-04-30 NOTE — Telephone Encounter (Signed)
Per [facility] automated line patient's [name] denial was overturned on Oct 10th 2022- approved for a year. Ran test claim, received refill too soon- was fill on 04/22/21.   Called patient and advised. He will reach out with any issues with filling medication.

## 2021-05-03 LAB — HM DIABETES EYE EXAM

## 2021-06-02 ENCOUNTER — Telehealth: Payer: Self-pay | Admitting: Primary Care

## 2021-06-02 DIAGNOSIS — J4540 Moderate persistent asthma, uncomplicated: Secondary | ICD-10-CM

## 2021-06-02 NOTE — Telephone Encounter (Signed)
Noted.   Will route to pharmacy team.

## 2021-06-02 NOTE — Telephone Encounter (Signed)
Call made to patient, confirmed DOB. Appt made for 06/21/21.   Nothing further needed at this time.

## 2021-06-02 NOTE — Telephone Encounter (Signed)
Refill sent for [facility] to [facility]: [phone]   Dose: 100 mg SQ every 4 weeks  Last OV: 10/06/20 Provider: Buelah Manis, NP Physician: Dr. Kendrick Fries - last seen by an MD on 02/21/18  Next OV: 10/06/21 Routing to triage to have patient established with physician sooner than March 2023  Chesley Mires, PharmD, MPH, BCPS Clinical Pharmacist (Rheumatology and Pulmonology)

## 2021-06-21 ENCOUNTER — Ambulatory Visit: Payer: 59 | Admitting: Pulmonary Disease

## 2021-07-05 ENCOUNTER — Encounter: Payer: Self-pay | Admitting: Pulmonary Disease

## 2021-07-05 ENCOUNTER — Other Ambulatory Visit: Payer: Self-pay

## 2021-07-05 ENCOUNTER — Ambulatory Visit (INDEPENDENT_AMBULATORY_CARE_PROVIDER_SITE_OTHER): Payer: 59 | Admitting: Pulmonary Disease

## 2021-07-05 VITALS — BP 128/82 | HR 84 | Ht 69.5 in | Wt 214.4 lb

## 2021-07-05 DIAGNOSIS — J301 Allergic rhinitis due to pollen: Secondary | ICD-10-CM

## 2021-07-05 DIAGNOSIS — J4540 Moderate persistent asthma, uncomplicated: Secondary | ICD-10-CM

## 2021-07-05 NOTE — Patient Instructions (Signed)
Continue [name] every 8 weeks  Use albuterol as needed  Take Zyrtec or Allegra daily for seasonal allergies (Ok to get generic pharmacy brand)  Use flonase 1 spray per nostril daily for sinus congestion due to allergies  Follow up in 6 months

## 2021-07-05 NOTE — Progress Notes (Signed)
Synopsis: Referred in December 2022 for Moderate Persistent Asthma on [name], previous Dr. Kendrick Fries patient  Subjective:   PATIENT ID: Mike Hayes GENDER: male DOB: 1966/06/27, MRN: [id number]   HPI  Chief Complaint  Patient presents with   Establish Care    Former [facility] patient for asthma. Still using [name] without any issues.    Mike Hayes is a 55 year old male, never smoker with moderate persistent asthma and allergic rhinitis who returns to pulmonary clinic for follow up.   He has been doing well since last follow up visit in 09/2020. He has been on fasenra since 2019 which has been a Secretary/administrator for him. He rarely needs albuterol as needed. He continues to have issues with seasonal allergies in regards to rhinitis and congestion. He is using as needed sudafed. He is not using any nasal sprays.   He is a never smoker. He is a Midwife. 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  Past Medical History:  Diagnosis Date   Childhood asthma    Diabetes mellitus    High blood pressure    Seasonal allergies    Sinus trouble      Family History  Problem Relation Age of Onset   Allergies Mother    Asthma Mother    Diabetes Mellitus II Mother    COPD Mother    Cancer Paternal Grandmother    Cancer Maternal Grandmother        was a heavy smoker   Cancer Maternal Grandfather        was a heavy smoker     Social History   Socioeconomic History   Marital status: Married    Spouse name: Not on file   Number of children: 3   Years of education: Not on file   Highest education level: Not on file  Occupational History   Occupation: Arboriculturist    Comment: Software engineer  Tobacco Use   Smoking status: Never   Smokeless tobacco: Never  Substance and Sexual Activity   Alcohol use: Yes    Alcohol/week: 0.0 standard drinks    Comment: socially   Drug use: No   Sexual activity: Not on file  Other Topics Concern   Not on file  Social History Narrative   Not on  file   Social Determinants of Health   Financial Resource Strain: Not on file  Food Insecurity: Not on file  Transportation Needs: Not on file  Physical Activity: Not on file  Stress: Not on file  Social Connections: Not on file  Intimate Partner Violence: Not on file     Allergies  Allergen Reactions   Exenatide Other (See Comments)    Got knots and rash.     Outpatient Medications Prior to Visit  Medication Sig Dispense Refill   Benralizumab ([name] PEN) 30 MG/ML SOAJ INJECT 30MG  SUBCUTANEOUSLY  EVERY 8 WEEKS 1 mL 2   carvedilol (COREG) 12.5 MG tablet Take 12.5 mg by mouth 2 (two) times daily.     Cholecalciferol (VITAMIN D) 1000 UNITS capsule Take 1,000 Units by mouth daily.      furosemide (LASIX) 20 MG tablet Take 20 mg by mouth daily.     glimepiride (AMARYL) 1 MG tablet Take 1 tablet by mouth 2 (two) times daily.      losartan (COZAAR) 100 MG tablet Take 100 mg by mouth daily.     metFORMIN (GLUCOPHAGE) 1000 MG tablet Take 1,000 mg by mouth 2 (two) times  daily.     Multiple Vitamin (MULTIVITAMIN WITH MINERALS) TABS tablet Take 1 tablet by mouth daily.     RYBELSUS 7 MG TABS Take 1 tablet by mouth every morning.     terbinafine (LAMISIL) 250 MG tablet Take 250 mg by mouth daily as needed.     VENTOLIN HFA 108 (90 Base) MCG/ACT inhaler INHALE 2 PUFFS BY MOUTH EVERY 4 TO 6 HOURS AS NEEDED 18 g 1   vitamin B-12 (CYANOCOBALAMIN) 500 MCG tablet Take 500 mcg by mouth daily.     No facility-administered medications prior to visit.    Review of Systems  Constitutional:  Negative for chills, fever, malaise/fatigue and weight loss.  HENT:  Negative for congestion, sinus pain and sore throat.   Eyes: Negative.   Respiratory:  Negative for cough, hemoptysis, sputum production, shortness of breath and wheezing.   Cardiovascular:  Negative for chest pain, palpitations, orthopnea, claudication and leg swelling.  Gastrointestinal:  Negative for abdominal pain, heartburn, nausea and  vomiting.  Genitourinary: Negative.   Musculoskeletal:  Negative for joint pain and myalgias.  Skin:  Negative for rash.  Neurological:  Negative for weakness.  Endo/Heme/Allergies: Negative.   Psychiatric/Behavioral: Negative.       Objective:   Vitals:   07/05/21 1423  BP: 128/82  Pulse: 84  SpO2: 98%  Weight: 214 lb 6.4 oz (97.3 kg)  Height: 5' 9.5" (1.765 m)     Physical Exam Constitutional:      General: He is not in acute distress. HENT:     Head: Normocephalic and atraumatic.  Eyes:     Conjunctiva/sclera: Conjunctivae normal.  Cardiovascular:     Rate and Rhythm: Normal rate and regular rhythm.     Pulses: Normal pulses.     Heart sounds: Normal heart sounds. No murmur heard. Pulmonary:     Effort: Pulmonary effort is normal.     Breath sounds: Normal breath sounds.  Musculoskeletal:     Right lower leg: No edema.     Left lower leg: No edema.  Skin:    General: Skin is warm and dry.  Neurological:     General: No focal deficit present.     Mental Status: He is alert.  Psychiatric:        Mood and Affect: Mood normal.        Behavior: Behavior normal.        Thought Content: Thought content normal.        Judgment: Judgment normal.   CBC    Component Value Date/Time   WBC 6.9 10/17/2017 1446   RBC 5.64 10/17/2017 1446   HGB 16.3 10/17/2017 1446   HCT 47.5 10/17/2017 1446   PLT 133.0 (L) 10/17/2017 1446   MCV 84.3 10/17/2017 1446   MCHC 34.2 10/17/2017 1446   RDW 13.5 10/17/2017 1446   LYMPHSABS 2.1 10/17/2017 1446   MONOABS 0.5 10/17/2017 1446   EOSABS 0.8 (H) 10/17/2017 1446   BASOSABS 0.1 10/17/2017 1446   Chest imaging: CXR 10/17/17 Lungs are clear. The heart size and pulmonary vascularity are normal. No adenopathy. There is mild anterior wedging of two lower thoracic vertebral bodies, stable.  PFT: PFT Results Latest Ref Rng & Units 09/24/2015  FVC-Pre L 4.68  FVC-Predicted Pre % 93  FVC-Post L 4.82  FVC-Predicted Post % 96  Pre  FEV1/FVC % % 77  Post FEV1/FCV % % 78  FEV1-Pre L 3.59  FEV1-Predicted Pre % 92  FEV1-Post L 3.78  DLCO uncorrected ml/min/mmHg  44.27  DLCO UNC% % 139  DLCO corrected ml/min/mmHg 42.67  DLCO COR %Predicted % 134  DLVA Predicted % 143  TLC L 7.10  TLC % Predicted % 103  RV % Predicted % 71   Labs:  Path:  Echo:  Heart Catheterization:    Assessment & Plan:   Moderate persistent asthma, unspecified whether complicated  Seasonal allergic rhinitis due to pollen  Discussion: Mike Hayes is a 56 year old male, never smoker with moderate persistent asthma and allergic rhinitis who returns to pulmonary clinic for follow up.   He is to continue on fasenra every 8 weeks for asthma. He can use albuterol as needed.   Recommend using OTC zyrtec or allegra for seasonal allergies and rhinitis. I also recommend using fluticasone nasal spray daily for sinus congestion.   Follow up in 6 months.   Melody Comas, MD [facility] Office: [phone]   See Amion for personal pager PCCM on call pager [phone] until 7pm. Please call Elink 7p-7a. [phone]     Current Outpatient Medications:    Benralizumab ([name] PEN) 30 MG/ML SOAJ, INJECT 30MG  SUBCUTANEOUSLY  EVERY 8 WEEKS, Disp: 1 mL, Rfl: 2   carvedilol (COREG) 12.5 MG tablet, Take 12.5 mg by mouth 2 (two) times daily., Disp: , Rfl:    Cholecalciferol (VITAMIN D) 1000 UNITS capsule, Take 1,000 Units by mouth daily. , Disp: , Rfl:    furosemide (LASIX) 20 MG tablet, Take 20 mg by mouth daily., Disp: , Rfl:    glimepiride (AMARYL) 1 MG tablet, Take 1 tablet by mouth 2 (two) times daily. , Disp: , Rfl:    losartan (COZAAR) 100 MG tablet, Take 100 mg by mouth daily., Disp: , Rfl:    metFORMIN (GLUCOPHAGE) 1000 MG tablet, Take 1,000 mg by mouth 2 (two) times daily., Disp: , Rfl:    Multiple Vitamin (MULTIVITAMIN WITH MINERALS) TABS tablet, Take 1 tablet by mouth daily., Disp: , Rfl:    RYBELSUS 7 MG  TABS, Take 1 tablet by mouth every morning., Disp: , Rfl:    terbinafine (LAMISIL) 250 MG tablet, Take 250 mg by mouth daily as needed., Disp: , Rfl:    VENTOLIN HFA 108 (90 Base) MCG/ACT inhaler, INHALE 2 PUFFS BY MOUTH EVERY 4 TO 6 HOURS AS NEEDED, Disp: 18 g, Rfl: 1   vitamin B-12 (CYANOCOBALAMIN) 500 MCG tablet, Take 500 mcg by mouth daily., Disp: , Rfl:

## 2021-09-27 ENCOUNTER — Encounter: Payer: Self-pay | Admitting: Family Medicine

## 2021-09-27 ENCOUNTER — Ambulatory Visit (INDEPENDENT_AMBULATORY_CARE_PROVIDER_SITE_OTHER): Payer: 59 | Admitting: Family Medicine

## 2021-09-27 VITALS — BP 140/98 | HR 80 | Temp 97.3°F | Resp 16 | Wt 217.2 lb

## 2021-09-27 DIAGNOSIS — Z0000 Encounter for general adult medical examination without abnormal findings: Secondary | ICD-10-CM | POA: Diagnosis not present

## 2021-09-27 DIAGNOSIS — E119 Type 2 diabetes mellitus without complications: Secondary | ICD-10-CM

## 2021-09-27 DIAGNOSIS — E559 Vitamin D deficiency, unspecified: Secondary | ICD-10-CM | POA: Diagnosis not present

## 2021-09-27 DIAGNOSIS — Z125 Encounter for screening for malignant neoplasm of prostate: Secondary | ICD-10-CM

## 2021-09-27 LAB — CBC WITH DIFFERENTIAL/PLATELET
Basophils Absolute: 0 10*3/uL (ref 0.0–0.1)
Basophils Relative: 0.2 % (ref 0.0–3.0)
Eosinophils Absolute: 0 10*3/uL (ref 0.0–0.7)
Eosinophils Relative: 0 % (ref 0.0–5.0)
HCT: 44.1 % (ref 39.0–52.0)
Hemoglobin: 15.3 g/dL (ref 13.0–17.0)
Lymphocytes Relative: 34 % (ref 12.0–46.0)
Lymphs Abs: 1.6 10*3/uL (ref 0.7–4.0)
MCHC: 34.8 g/dL (ref 30.0–36.0)
MCV: 83.8 fl (ref 78.0–100.0)
Monocytes Absolute: 0.4 10*3/uL (ref 0.1–1.0)
Monocytes Relative: 7.5 % (ref 3.0–12.0)
Neutro Abs: 2.7 10*3/uL (ref 1.4–7.7)
Neutrophils Relative %: 58.3 % (ref 43.0–77.0)
Platelets: 122 10*3/uL — ABNORMAL LOW (ref 150.0–400.0)
RBC: 5.27 Mil/uL (ref 4.22–5.81)
RDW: 13.8 % (ref 11.5–15.5)
WBC: 4.7 10*3/uL (ref 4.0–10.5)

## 2021-09-27 LAB — HEPATIC FUNCTION PANEL
ALT: 20 U/L (ref 0–53)
AST: 18 U/L (ref 0–37)
Albumin: 4.6 g/dL (ref 3.5–5.2)
Alkaline Phosphatase: 46 U/L (ref 39–117)
Bilirubin, Direct: 0.3 mg/dL (ref 0.0–0.3)
Total Bilirubin: 1.2 mg/dL (ref 0.2–1.2)
Total Protein: 6.7 g/dL (ref 6.0–8.3)

## 2021-09-27 LAB — TSH: TSH: 0.77 u[IU]/mL (ref 0.35–5.50)

## 2021-09-27 LAB — LIPID PANEL
Cholesterol: 75 mg/dL (ref 0–200)
HDL: 57.3 mg/dL (ref >39.00)
LDL Cholesterol: 11 mg/dL (ref 0–99)
NonHDL: 17.57
Total CHOL/HDL Ratio: 1
Triglycerides: 33 mg/dL (ref 0.0–149.0)
VLDL: 6.6 mg/dL (ref 0.0–40.0)

## 2021-09-27 LAB — HEMOGLOBIN A1C: Hgb A1c MFr Bld: 6.8 % — ABNORMAL HIGH (ref 4.6–6.5)

## 2021-09-27 LAB — BASIC METABOLIC PANEL
BUN: 11 mg/dL (ref 6–23)
CO2: 27 mEq/L (ref 19–32)
Calcium: 10.3 mg/dL (ref 8.4–10.5)
Chloride: 106 mEq/L (ref 96–112)
Creatinine, Ser: 0.96 mg/dL (ref 0.40–1.50)
GFR: 88.94 mL/min (ref >60.00)
Glucose, Bld: 106 mg/dL — ABNORMAL HIGH (ref 70–99)
Potassium: 3.7 mEq/L (ref 3.5–5.1)
Sodium: 141 mEq/L (ref 135–145)

## 2021-09-27 LAB — VITAMIN D 25 HYDROXY (VIT D DEFICIENCY, FRACTURES): VITD: 46.27 ng/mL (ref 30.00–100.00)

## 2021-09-27 LAB — PSA: PSA: 0.32 ng/mL (ref 0.10–4.00)

## 2021-09-27 NOTE — Patient Instructions (Signed)
Follow up in 3-4 weeks to recheck blood pressure ?We'll notify you of your lab results and make any changes if needed ?Continue to work on healthy diet and regular exercise- you can do it! ?Call with any questions or concerns ?Stay Safe!  Stay Healthy!! ?I know things are so hard right now, but hang in there!! ?

## 2021-09-27 NOTE — Assessment & Plan Note (Signed)
Chronic problem.  Following w/ Endo.  Reports he is UTD on foot exam, eye exam, on ARB for renal protection.  Check A1C and send labs to Endo ?

## 2021-09-27 NOTE — Assessment & Plan Note (Signed)
Pt's PE WNL w/ exception of BMI and BP. BP is understandably high as he worked overnight and dad is on Hospice and they expect it will be only a matter of days. UTD on Tdap, colonoscopy.  Check labs.  Anticipatory guidance provided.  ?

## 2021-09-27 NOTE — Progress Notes (Signed)
? ?  Subjective:  ? ? Patient ID: Mike Hayes, male    DOB: 1965-08-09, 56 y.o.   MRN: [id number] ? ?HPI ?CPE- UTD on colonoscopy, Tdap, foot exam.  Pt had eye exam in August w/ [facility] ? ?Health Maintenance  ?Topic Date Due  ? OPHTHALMOLOGY EXAM  02/23/2021  ? INFLUENZA VACCINE  10/15/2021 (Originally 02/15/2021)  ? Hepatitis C Screening  04/06/2022 (Originally 02/26/1984)  ? HIV Screening  04/06/2022 (Originally 02/25/1981)  ? HEMOGLOBIN A1C  10/03/2021  ? FOOT EXAM  04/05/2022  ? TETANUS/TDAP  02/25/2026  ? COLONOSCOPY (Pts 45-3yrs Insurance coverage will need to be confirmed)  08/08/2026  ? HPV VACCINES  Aged Out  ? COVID-19 Vaccine  Discontinued  ? Zoster Vaccines- Shingrix  Discontinued  ?  ? ? ?Review of Systems ?Patient reports no vision/hearing changes, anorexia, fever ,adenopathy, persistant/recurrent hoarseness, swallowing issues, chest pain, palpitations, edema, persistant/recurrent cough, hemoptysis, dyspnea (rest,exertional, paroxysmal nocturnal), gastrointestinal  bleeding (melena, rectal bleeding), abdominal pain, excessive heart burn, GU symptoms (dysuria, hematuria, voiding/incontinence issues) syncope, focal weakness, memory loss, numbness & tingling, skin/hair/nail changes, depression, anxiety, abnormal bruising/bleeding, musculoskeletal symptoms/signs.  ? ?+ elevated BP- dad is in Hospice which is understandably stressful.  He works 3rd shift and has not had any sleep yet. ? ?+ physically and emotionally exhausted ? ?This visit occurred during the SARS-CoV-2 public health emergency.  Safety protocols were in place, including screening questions prior to the visit, additional usage of staff PPE, and extensive cleaning of exam room while observing appropriate contact time as indicated for disinfecting solutions.   ?   ?Objective:  ? Physical Exam ?General Appearance:    Alert, cooperative, no distress, appears stated age  ?Head:    Normocephalic, without obvious abnormality,  atraumatic  ?Eyes:    PERRL, conjunctiva/corneas clear, EOM's intact, fundi  ?  benign, both eyes       ?Ears:    Normal TM's and external ear canals, both ears  ?Nose:   Deferred due to COVID  ?Throat:   ?Neck:   Supple, symmetrical, trachea midline, no adenopathy;     ?  thyroid:  No enlargement/tenderness/nodules  ?Back:     Symmetric, no curvature, ROM normal, no CVA tenderness  ?Lungs:     Clear to auscultation bilaterally, respirations unlabored  ?Chest wall:    No tenderness or deformity  ?Heart:    Regular rate and rhythm, S1 and S2 normal, no murmur, rub ?  or gallop  ?Abdomen:     Soft, non-tender, bowel sounds active all four quadrants,  ?  no masses, no organomegaly  ?Genitalia:    Normal male without lesion, masses,discharge or tenderness  ?Rectal:    Deferred due to young age  ?Extremities:   Extremities normal, atraumatic, no cyanosis or edema  ?Pulses:   2+ and symmetric all extremities  ?Skin:   Skin color, texture, turgor normal, no rashes or lesions  ?Lymph nodes:   Cervical, supraclavicular, and axillary nodes normal  ?Neurologic:   CNII-XII intact. Normal strength, sensation and reflexes    ?  throughout  ?  ? ? ? ?   ?Assessment & Plan:  ? ? ?

## 2021-09-27 NOTE — Assessment & Plan Note (Signed)
Check labs and replete prn. 

## 2021-10-05 ENCOUNTER — Encounter: Payer: 59 | Admitting: Family Medicine

## 2021-10-06 ENCOUNTER — Ambulatory Visit: Payer: 59 | Admitting: Primary Care

## 2021-10-16 ENCOUNTER — Other Ambulatory Visit: Payer: Self-pay | Admitting: Primary Care

## 2021-10-16 DIAGNOSIS — J4540 Moderate persistent asthma, uncomplicated: Secondary | ICD-10-CM

## 2021-10-18 NOTE — Telephone Encounter (Signed)
Refill sent for [facility] to [facility]: [phone]  ? ?Dose: 30 mg SQ every 8 weeks ? ?Last OV: 07/05/21 ?Provider: Dr. Francine Graven ? ?Next OV: June 2023 ? ?Chesley Mires, PharmD, MPH, BCPS ?Clinical Pharmacist (Rheumatology and Pulmonology)  ?

## 2021-11-01 ENCOUNTER — Ambulatory Visit: Payer: 59 | Admitting: Family Medicine

## 2021-11-19 ENCOUNTER — Encounter: Payer: Self-pay | Admitting: Family Medicine

## 2022-01-10 ENCOUNTER — Telehealth: Payer: Self-pay | Admitting: Pulmonary Disease

## 2022-01-10 DIAGNOSIS — J4540 Moderate persistent asthma, uncomplicated: Secondary | ICD-10-CM

## 2022-01-12 NOTE — Telephone Encounter (Signed)
Refill sent for [facility] to [facility]: [phone]   Dose: 30 mg SQ every 8 weeks  Last OV: 07/05/21 Provider: Dr. Francine Graven  Next OV: not yet scheduled, routing to scheduling team  Chesley Mires, PharmD, MPH, BCPS Clinical Pharmacist (Rheumatology and Pulmonology)

## 2022-01-14 NOTE — Telephone Encounter (Signed)
Called patient and left message to call back to schedule appointment.

## 2022-01-27 ENCOUNTER — Ambulatory Visit: Payer: 59 | Admitting: Nurse Practitioner

## 2022-02-16 ENCOUNTER — Encounter: Payer: Self-pay | Admitting: Primary Care

## 2022-02-16 ENCOUNTER — Telehealth: Payer: Self-pay | Admitting: Primary Care

## 2022-02-16 ENCOUNTER — Ambulatory Visit: Payer: 59 | Admitting: Primary Care

## 2022-02-16 DIAGNOSIS — J4540 Moderate persistent asthma, uncomplicated: Secondary | ICD-10-CM

## 2022-02-16 MED ORDER — FASENRA PEN 30 MG/ML ~~LOC~~ SOAJ
30.0000 mg | SUBCUTANEOUS | 4 refills | Status: DC
  Filled 2022-05-30: qty 1, 30d supply, fill #0

## 2022-02-16 NOTE — Patient Instructions (Addendum)
Recommendations  Continue [name] injections as directed  Continue albuterol 2 puffs every 4-6 hours as needed If you develop increased shortness of breath, chest tightness, wheezing or productive cough please notify office  Follow-up 1 year or sooner as needed  Asthma, Adult  Asthma is a condition that causes swelling and narrowing of the airways. These are the passages that lead from the nose and mouth down into the lungs. When asthma symptoms get worse it is called an asthma attack or flare. This can make it hard to breathe. Asthma flares can range from minor to life-threatening. There is no cure for asthma, but medicines and lifestyle changes can help to control it. What are the causes? It is not known exactly what causes asthma, but certain things can cause asthma symptoms to get worse (triggers). What can trigger an asthma attack? Cigarette smoke. Mold. Dust. Your pet's skin flakes (dander). Cockroaches. Pollen. Air pollution (like household cleaners, wood smoke, smog, or Therapist, occupational). What are the signs or symptoms? Trouble breathing (shortness of breath). Coughing. Making high-pitched whistling sounds when you breathe, most often when you breathe out (wheezing). Chest tightness. Tiredness with little activity. Poor exercise tolerance. How is this treated? Controller medicines that help prevent asthma symptoms. Fast-acting reliever or rescue medicines. These give short-term relief of asthma symptoms. Allergy medicines if your attacks are brought on by allergens. Medicines to help control the body's defense (immune) system. Staying away from the things that cause asthma attacks. Follow these instructions at home: Avoiding triggers in your home Do not allow anyone to smoke in your home. Limit use of fireplaces and wood stoves. Get rid of pests (such as roaches and mice) and their droppings. Keep your home clean. Clean your floors. Dust regularly. Use cleaning products  that do not smell. Wash bed sheets and blankets every week in hot water. Dry them in a dryer. Have someone vacuum when you are not home. Change your heating and air conditioning filters often. Use blankets that are made of polyester or cotton. General instructions Take over-the-counter and prescription medicines only as told by your doctor. Do not smoke or use any products that contain nicotine or tobacco. If you need help quitting, ask your doctor. Stay away from secondhand smoke. Avoid doing things outdoors when allergen counts are high and when air quality is low. Warm up before you exercise. Take time to cool down after exercise. Use a peak flow meter as told by your doctor. A peak flow meter is a tool that measures how well your lungs are working. Keep track of the peak flow meter's readings. Write them down. Follow your asthma action plan. This is a written plan for taking care of your asthma and treating your attacks. Make sure you get all the shots (vaccines) that your doctor recommends. Ask your doctor about a flu shot and a pneumonia shot. Keep all follow-up visits. Contact a doctor if: You have wheezing, shortness of breath, or a cough even while taking medicine to prevent attacks. The mucus you cough up (sputum) is thicker than usual. The mucus you cough up changes from clear or white to yellow, green, gray, or is bloody. You have problems from the medicine you are taking, such as: A rash. Itching. Swelling. Trouble breathing. You need reliever medicines more than 2-3 times a week. Your peak flow reading is still at 50-79% of your personal best after following the action plan for 1 hour. You have a fever. Get help right away if: You  seem to be worse and are not responding to medicine during an asthma attack. You are short of breath even at rest. You get short of breath when doing very little activity. You have trouble eating, drinking, or talking. You have chest pain or  tightness. You have a fast heartbeat. Your lips or fingernails start to turn blue. You are light-headed or dizzy, or you faint. Your peak flow is less than 50% of your personal best. You feel too tired to breathe normally. These symptoms may be an emergency. Get help right away. Call 911. Do not wait to see if the symptoms will go away. Do not drive yourself to the hospital. Summary Asthma is a long-term (chronic) condition in which the airways get tight and narrow. An asthma attack can make it hard to breathe. Asthma cannot be cured, but medicines and lifestyle changes can help control it. Make sure you understand how to avoid triggers and how and when to use your medicines. Avoid things that can cause allergy symptoms (allergens). These include animal skin flakes (dander) and pollen from trees or grass. Avoid things that pollute the air. These may include household cleaners, wood smoke, smog, or chemical odors. This information is not intended to replace advice given to you by your health care provider. Make sure you discuss any questions you have with your health care provider. Document Revised: 04/12/2021 Document Reviewed: 04/12/2021 Elsevier Patient Education  2023 [employer].

## 2022-02-16 NOTE — Progress Notes (Signed)
@Patient  ID: Hair, male    DOB: [birth year], 56 y.o.   MRN: [id number]  Chief Complaint  Patient presents with   Follow-up    No new issues since LOV.    Referring provider: [id number], MD  HPI: 56 year old male, never smoked.  Past medical history significant for moderate persistent asthma and allergic rhinitis.  Patient of Dr. 53, last seen in office on 07/05/2021.  He has been on 07/07/2021 since 2019.   RAST positive, IgE 700. PFTs in 2017>> ratio 78, FEV1 3.78 (78%).  Patient works as 2020.  Previously in the [facility] from [id number].  02/16/2022- Interim hx  Patient presents today for overdue follow-up/asthma. He is doing well. He has no respiratory symptoms.  He has some occasional rhinitis symptoms after mowing, he takes over-the-counter generic Allegra daily.  Nocturnal wheezing resolved after starting biologic. He has not required albuterol since being on [name].  He is doing self injections at home every 2 months. No issues getting medication. Next dose is due Sept 22nd. CAT score 25. He denies shortness of breath, wheezing, chest tightness or cough.    Allergies  Allergen Reactions   Exenatide Other (See Comments)    Got knots and rash.    Immunization History  Administered Date(s) Administered   Janssen (J&J) SARS-COV-2 Vaccination 03/11/2020   Tdap 02/26/2016    Past Medical History:  Diagnosis Date   Childhood asthma    Diabetes mellitus    High blood pressure    Seasonal allergies    Sinus trouble     Tobacco History: Social History   Tobacco Use  Smoking Status Never   Passive exposure: Never  Smokeless Tobacco Never   Counseling given: Not Answered   Outpatient Medications Prior to Visit  Medication Sig Dispense Refill   Benralizumab ([name] PEN) 30 MG/ML SOAJ INJECT 30MG  SUBCUTANEOUSLY EVERY 8 WEEKS 1 mL 0   carvedilol (COREG) 12.5 MG tablet Take 12.5 mg by mouth 2 (two) times daily.     furosemide (LASIX) 20 MG  tablet Take 20 mg by mouth daily.     glimepiride (AMARYL) 1 MG tablet Take 1 tablet by mouth 2 (two) times daily.      losartan (COZAAR) 100 MG tablet Take 100 mg by mouth daily.     metFORMIN (GLUCOPHAGE) 1000 MG tablet Take 1,000 mg by mouth 2 (two) times daily.     Multiple Vitamin (MULTIVITAMIN WITH MINERALS) TABS tablet Take 1 tablet by mouth daily.     RYBELSUS 7 MG TABS Take 1 tablet by mouth every morning.     terbinafine (LAMISIL) 250 MG tablet Take 250 mg by mouth daily as needed.     VENTOLIN HFA 108 (90 Base) MCG/ACT inhaler INHALE 2 PUFFS BY MOUTH EVERY 4 TO 6 HOURS AS NEEDED 18 g 1   vitamin B-12 (CYANOCOBALAMIN) 500 MCG tablet Take 500 mcg by mouth daily.     Cholecalciferol (VITAMIN D) 1000 UNITS capsule Take 1,000 Units by mouth daily.      No facility-administered medications prior to visit.   Review of Systems  Review of Systems  Constitutional: Negative.   HENT: Negative.    Respiratory: Negative.  Negative for cough, chest tightness, shortness of breath and wheezing.   Cardiovascular: Negative.     Physical Exam  BP (!) 152/80 (BP Location: Left Arm, Patient Position: Sitting, Cuff Size: Normal)   Pulse 82   Temp 97.8 F (36.6 C) (Oral)   Ht 5'  8" (1.727 m)   Wt 210 lb 12.8 oz (95.6 kg)   SpO2 96%   BMI 32.05 kg/m  Physical Exam Constitutional:      Appearance: Normal appearance.  HENT:     Head: Normocephalic and atraumatic.  Cardiovascular:     Rate and Rhythm: Normal rate and regular rhythm.  Pulmonary:     Effort: Pulmonary effort is normal.     Breath sounds: Normal breath sounds.  Musculoskeletal:        General: Normal range of motion.  Skin:    General: Skin is warm and dry.  Neurological:     General: No focal deficit present.     Mental Status: He is alert and oriented to person, place, and time. Mental status is at baseline.  Psychiatric:        Mood and Affect: Mood normal.        Behavior: Behavior normal.        Thought Content:  Thought content normal.        Judgment: Judgment normal.      Lab Results:  CBC    Component Value Date/Time   WBC 4.7 09/27/2021 0824   RBC 5.27 09/27/2021 0824   HGB 15.3 09/27/2021 0824   HCT 44.1 09/27/2021 0824   PLT 122.0 (L) 09/27/2021 0824   MCV 83.8 09/27/2021 0824   MCHC 34.8 09/27/2021 0824   RDW 13.8 09/27/2021 0824   LYMPHSABS 1.6 09/27/2021 0824   MONOABS 0.4 09/27/2021 0824   EOSABS 0.0 09/27/2021 0824   BASOSABS 0.0 09/27/2021 0824    BMET    Component Value Date/Time   NA 141 09/27/2021 0824   K 3.7 09/27/2021 0824   CL 106 09/27/2021 0824   CO2 27 09/27/2021 0824   GLUCOSE 106 (H) 09/27/2021 0824   BUN 11 09/27/2021 0824   CREATININE 0.96 09/27/2021 0824   CALCIUM 10.3 09/27/2021 0824    BNP No results found for: "BNP"  ProBNP No results found for: "PROBNP"  Imaging: No results found.   Assessment & Plan:   Moderate persistent asthma (well managed on [name]) - Patient has been on [name] since 2019. Asthma symptoms are well controlled.  No recent exacerbations or hospitalizations.  No Saba use. ACT score 25. He is compliant with medication. No changes today. Continue [name] 30mg  [place] q 8 weeks and PRN Albuterol hfa 2 puffs every 4-6 as needed for sob/wheezing.     , NP 02/16/2022

## 2022-02-16 NOTE — Telephone Encounter (Signed)
Refill sent for [facility] to [facility]: [phone]   Dose: 30 mg SQ every 8 weeks  Last OV: 02/16/22 Provider: Dr. Francine Graven (seen by Buelah Manis, NP)  Next OV: 1 year (not yet scheduled)  Chesley Mires, PharmD, MPH, BCPS Clinical Pharmacist (Rheumatology and Pulmonology)

## 2022-02-16 NOTE — Telephone Encounter (Signed)
Just wanted to make sure with you guys that patient's prescription for Mike Hayes is up-to-date.  I saw him in office today for an regular overview and he is doing well.  He is giving himself [name] injections at home every 2 months.  No issues.

## 2022-02-16 NOTE — Assessment & Plan Note (Signed)
-   Patient has been on [place] since 2019. Asthma symptoms are well controlled.  No recent exacerbations or hospitalizations.  No Saba use. ACT score 25. He is compliant with medication. No changes today. Continue [name] 30mg  [place] q 8 weeks and PRN Albuterol hfa 2 puffs every 4-6 as needed for sob/wheezing.

## 2022-05-30 ENCOUNTER — Other Ambulatory Visit (HOSPITAL_COMMUNITY): Payer: Self-pay

## 2022-05-30 ENCOUNTER — Telehealth: Payer: Self-pay

## 2022-05-30 NOTE — Telephone Encounter (Signed)
Pharmacy Patient Advocate Encounter  Prior Authorization for [name] 30MG /ML has been approved.    PA# Effective dates: 05/30/2022 through 05/31/2023

## 2022-05-30 NOTE — Telephone Encounter (Signed)
Patient Advocate Encounter   Received notification from [employer] that prior authorization for [name] 30MG /ML is required.   PA submitted on 05/30/2022 Key [id number] Status is pending       06/01/2022, [facility] Pharmacy Patient Advocate Specialist [facility] Patient Advocate Team Direct Number: [phone] Fax: [phone]

## 2022-05-31 ENCOUNTER — Other Ambulatory Visit (HOSPITAL_COMMUNITY): Payer: Self-pay

## 2022-06-01 ENCOUNTER — Other Ambulatory Visit (HOSPITAL_COMMUNITY): Payer: Self-pay

## 2022-06-06 ENCOUNTER — Other Ambulatory Visit (HOSPITAL_COMMUNITY): Payer: Self-pay

## 2022-07-07 ENCOUNTER — Telehealth: Payer: Self-pay | Admitting: Primary Care

## 2022-07-07 NOTE — Telephone Encounter (Signed)
PT calling about questions he has about  on [facility] notes from 02/16/2022.  States "Ended medications" yet he has been taking [place] every month since then. He sees the "discontinued" notation bother him the most. He'd like to see it corrected if poss.    Please call to advise. TY   [phone]

## 2022-07-08 NOTE — Telephone Encounter (Signed)
Patient is having questions on his [name] medication. He states on his medication list it says discontinued. But I see a noted the the PA was approved till next year.   Did something happen that we know of why it was discontinued from his list. He is concerned.   Please advise.

## 2022-07-12 NOTE — Telephone Encounter (Signed)
[name] added back to patient's med list. Not sure why it was discontinued.  Chesley Mires, PharmD, MPH, BCPS, CPP Clinical Pharmacist (Rheumatology and Pulmonology)

## 2022-07-29 ENCOUNTER — Encounter: Payer: Self-pay | Admitting: Primary Care

## 2022-07-29 ENCOUNTER — Ambulatory Visit: Payer: 59 | Admitting: Primary Care

## 2022-07-29 VITALS — BP 160/90 | HR 74 | Temp 98.6°F | Ht 68.0 in | Wt 214.6 lb

## 2022-07-29 DIAGNOSIS — R0683 Snoring: Secondary | ICD-10-CM | POA: Diagnosis not present

## 2022-07-29 DIAGNOSIS — I10 Essential (primary) hypertension: Secondary | ICD-10-CM

## 2022-07-29 NOTE — Patient Instructions (Addendum)
Sleep apnea is defined as period of 10 seconds or longer when you stop breathing at night. This can happen multiple times a night. Dx sleep apnea is when this occurs more than 5 times an hour.    Mild OSA 5-15 apneic events an hour Moderate OSA 15-30 apneic events an hour Severe OSA > 30 apneic events an hour   Untreated sleep apnea puts you at higher risk for cardiac arrhythmias, pulmonary HTN, stroke and diabetes    Treatment options include weight loss, side sleeping position, oral appliance, CPAP therapy or referral to ENT for possible surgical options    Recommendations: Focus on side sleeping position or elevate head with wedge pillow 30 degrees Work on weight loss efforts if able  Do not drive if experiencing excessive daytime sleepiness of fatigue    Order: Home sleep study   Follow-up: Call 1-2 weeks after completing sleep study to schedule follow-up visit with [facility] NP to review sleep study results and treatment options if needed   Sleep Apnea Sleep apnea is a condition in which breathing pauses or becomes shallow during sleep. People with sleep apnea usually snore loudly. They may have times when they gasp and stop breathing for 10 seconds or more during sleep. This may happen many times during the night. Sleep apnea disrupts your sleep and keeps your body from getting the rest that it needs. This condition can increase your risk of certain health problems, including: Heart attack. Stroke. Obesity. Type 2 diabetes. Heart failure. Irregular heartbeat. High blood pressure. The goal of treatment is to help you breathe normally again. What are the causes?  The most common cause of sleep apnea is a collapsed or blocked airway. There are three kinds of sleep apnea: Obstructive sleep apnea. This kind is caused by a blocked or collapsed airway. Central sleep apnea. This kind happens when the part of the brain that controls breathing does not send the correct signals to the  muscles that control breathing. Mixed sleep apnea. This is a combination of obstructive and central sleep apnea. What increases the risk? You are more likely to develop this condition if you: Are overweight. Smoke. Have a smaller than normal airway. Are older. Are male. Drink alcohol. Take sedatives or tranquilizers. Have a family history of sleep apnea. Have a tongue or tonsils that are larger than normal. What are the signs or symptoms? Symptoms of this condition include: Trouble staying asleep. Loud snoring. Morning headaches. Waking up gasping. Dry mouth or sore throat in the morning. Daytime sleepiness and tiredness. If you have daytime fatigue because of sleep apnea, you may be more likely to have: Trouble concentrating. Forgetfulness. Irritability or mood swings. Personality changes. Feelings of depression. Sexual dysfunction. This may include loss of interest if you are male, or erectile dysfunction if you are male. How is this diagnosed? This condition may be diagnosed with: A medical history. A physical exam. A series of tests that are done while you are sleeping (sleep study). These tests are usually done in a sleep lab, but they may also be done at home. How is this treated? Treatment for this condition aims to restore normal breathing and to ease symptoms during sleep. It may involve managing health issues that can affect breathing, such as high blood pressure or obesity. Treatment may include: Sleeping on your side. Using a decongestant if you have nasal congestion. Avoiding the use of depressants, including alcohol, sedatives, and narcotics. Losing weight if you are overweight. Making changes to your  diet. Quitting smoking. Using a device to open your airway while you sleep, such as: An oral appliance. This is a custom-made mouthpiece that shifts your lower jaw forward. A continuous positive airway pressure (CPAP) device. This device blows air through a  mask when you breathe out (exhale). A nasal expiratory positive airway pressure (EPAP) device. This device has valves that you put into each nostril. A bi-level positive airway pressure (BIPAP) device. This device blows air through a mask when you breathe in (inhale) and breathe out (exhale). Having surgery if other treatments do not work. During surgery, excess tissue is removed to create a wider airway. Follow these instructions at home: Lifestyle Make any lifestyle changes that your health care provider recommends. Eat a healthy, well-balanced diet. Take steps to lose weight if you are overweight. Avoid using depressants, including alcohol, sedatives, and narcotics. Do not use any products that contain nicotine or tobacco. These products include cigarettes, chewing tobacco, and vaping devices, such as e-cigarettes. If you need help quitting, ask your health care provider. General instructions Take over-the-counter and prescription medicines only as told by your health care provider. If you were given a device to open your airway while you sleep, use it only as told by your health care provider. If you are having surgery, make sure to tell your health care provider you have sleep apnea. You may need to bring your device with you. Keep all follow-up visits. This is important. Contact a health care provider if: The device that you received to open your airway during sleep is uncomfortable or does not seem to be working. Your symptoms do not improve. Your symptoms get worse. Get help right away if: You develop: Chest pain. Shortness of breath. Discomfort in your back, arms, or stomach. You have: Trouble speaking. Weakness on one side of your body. Drooping in your face. These symptoms may represent a serious problem that is an emergency. Do not wait to see if the symptoms will go away. Get medical help right away. Call your local emergency services (911 in the [place] Do not drive yourself  to the hospital. Summary Sleep apnea is a condition in which breathing pauses or becomes shallow during sleep. The most common cause is a collapsed or blocked airway. The goal of treatment is to restore normal breathing and to ease symptoms during sleep. This information is not intended to replace advice given to you by your health care provider. Make sure you discuss any questions you have with your health care provider. Document Revised: 02/10/2021 Document Reviewed: 06/12/2020 Elsevier Patient Education  [employer].

## 2022-07-29 NOTE — Assessment & Plan Note (Signed)
-  BP elevated today; 160/90 by provider check. He is asymptomatic.  - Blood pressure medications recent adjusted - Advised patient check BP at home over the next couple of days and if remains elevated to notify PCP

## 2022-07-29 NOTE — Progress Notes (Signed)
@Patient  ID: Mike Hayes, male    DOB: June 24, 1966, 57 y.o.   MRN: [id number]  Chief Complaint  Patient presents with   Follow-up    snoring    Referring provider: Midge Minium, MD  HPI: 57 year old male, never smoked. PMH significant for HTN, asthma, allergic rhinitis, diabetes mellitus, vit D deficiency.   07/29/2022 Patient presents today for sleep consult. He has symptoms loud snoring, witnessed apnea, non-restorative sleep and daytime sleepiness. He has woke himself up snoring. He takes no sleep aids. He sleeps on his side or back. Typical bed time 9-10pm. It takes him 10 mins to fall asleep. He wakes up 3-4 times a night. He starts his day at 4:40am if on dayshift. He gets 5-6 hours of sleep a night. He does not feel rested when he wakes up. Weight is up 10 lbs. He does not operate heavy machinery. Epworth 15. Denies narcolepsy, cataplexy, sleep walking.   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   Allergies  Allergen Reactions   Exenatide Other (See Comments)    Got knots and rash.    Immunization History  Administered Date(s) Administered   Janssen (J&J) SARS-COV-2 Vaccination 03/11/2020   Tdap 02/26/2016    Past Medical History:  Diagnosis Date   Childhood asthma    Diabetes mellitus    High blood pressure    Seasonal allergies    Sinus trouble     Tobacco History: Social History   Tobacco Use  Smoking Status Never   Passive exposure: Never  Smokeless Tobacco Never   Counseling given: Not Answered   Outpatient Medications Prior to Visit  Medication Sig Dispense Refill   Benralizumab ([name] PEN) 30 MG/ML SOAJ Inject 30 mg into the skin every 8 (eight) weeks.     carvedilol (COREG) 12.5 MG tablet Take 12.5 mg by mouth 2 (two) times daily.     glimepiride (AMARYL) 1 MG tablet Take 1 tablet by mouth 2 (two) times daily.      losartan (COZAAR) 100 MG tablet Take 100 mg by mouth daily.      metFORMIN (GLUCOPHAGE) 1000 MG tablet Take 1,000 mg by mouth 2 (two) times daily.     Multiple Vitamin (MULTIVITAMIN WITH MINERALS) TABS tablet Take 1 tablet by mouth daily.     RYBELSUS 7 MG TABS Take 1 tablet by mouth every morning.     sildenafil (VIAGRA) 100 MG tablet Take 100 mg by mouth as needed.     VENTOLIN HFA 108 (90 Base) MCG/ACT inhaler INHALE 2 PUFFS BY MOUTH EVERY 4 TO 6 HOURS AS NEEDED 18 g 1   vitamin B-12 (CYANOCOBALAMIN) 500 MCG tablet Take 500 mcg by mouth daily.     furosemide (LASIX) 20 MG tablet Take 20 mg by mouth daily.     terbinafine (LAMISIL) 250 MG tablet Take 250 mg by mouth daily as needed. (Patient not taking: Reported on 07/29/2022)     No facility-administered medications prior to visit.   Review of Systems  Review of Systems  Constitutional:  Positive for fatigue.  Respiratory:  Positive for apnea.   Psychiatric/Behavioral:  Positive for sleep disturbance.    Physical Exam  BP (!) 160/90 (BP Location: Left Arm, Cuff Size: Normal)   Pulse 74   Temp 98.6 F (37 C) (Oral)   Ht 5\' 8"  (1.727 m)   Wt 214 lb 9.6 oz (97.3 kg)   SpO2 94%   BMI  32.63 kg/m  Physical Exam Constitutional:      Appearance: Normal appearance.  HENT:     Head: Normocephalic and atraumatic.  Cardiovascular:     Rate and Rhythm: Normal rate and regular rhythm.  Pulmonary:     Effort: Pulmonary effort is normal.     Breath sounds: Normal breath sounds.  Skin:    General: Skin is warm and dry.  Neurological:     General: No focal deficit present.     Mental Status: He is alert and oriented to person, place, and time. Mental status is at baseline.  Psychiatric:        Mood and Affect: Mood normal.        Behavior: Behavior normal.        Thought Content: Thought content normal.        Judgment: Judgment normal.      Lab Results:  CBC    Component Value Date/Time   WBC 4.7 09/27/2021 0824   RBC 5.27 09/27/2021 0824   HGB 15.3 09/27/2021 0824   HCT 44.1  09/27/2021 0824   PLT 122.0 (L) 09/27/2021 0824   MCV 83.8 09/27/2021 0824   MCHC 34.8 09/27/2021 0824   RDW 13.8 09/27/2021 0824   LYMPHSABS 1.6 09/27/2021 0824   MONOABS 0.4 09/27/2021 0824   EOSABS 0.0 09/27/2021 0824   BASOSABS 0.0 09/27/2021 0824    BMET    Component Value Date/Time   NA 141 09/27/2021 0824   K 3.7 09/27/2021 0824   CL 106 09/27/2021 0824   CO2 27 09/27/2021 0824   GLUCOSE 106 (H) 09/27/2021 0824   BUN 11 09/27/2021 0824   CREATININE 0.96 09/27/2021 0824   CALCIUM 10.3 09/27/2021 0824    BNP No results found for: "BNP"  ProBNP No results found for: "PROBNP"  Imaging: No results found.   Assessment & Plan:   Loud snoring - Patient has symptoms of loud snoring, witnessed apnea, non-restorative sleep. Epworth 15. BMI 32.  Moderate suspicion patient has underlying obstructive sleep apnea, needs home sleep study to evaluate.  We reviewed risks of untreated sleep apnea including cardiac arrhythmias, pulmonary hypertension, diabetes and stroke.  We also discussed treatment options including weight loss, oral appliance, CPAP therapy or referral to ENT for possible surgical options.  Encourage side sleeping position and weight loss efforts.  Advised against driving if experiencing excessive daytime sleepiness fatigue.  Follow-up 1 to 2 weeks after sleep study review results and treatment options if needed.  HTN (hypertension) - BP elevated today; 160/90 by provider check. He is asymptomatic.  - Blood pressure medications recent adjusted - Advised patient check BP at home over the next couple of days and if remains elevated to notify PCP    Martyn Ehrich, NP 07/29/2022

## 2022-07-29 NOTE — Assessment & Plan Note (Signed)
-  Patient has symptoms of loud snoring, witnessed apnea, non-restorative sleep. Epworth 15. BMI 32.  Moderate suspicion patient has underlying obstructive sleep apnea, needs home sleep study to evaluate.  We reviewed risks of untreated sleep apnea including cardiac arrhythmias, pulmonary hypertension, diabetes and stroke.  We also discussed treatment options including weight loss, oral appliance, CPAP therapy or referral to ENT for possible surgical options.  Encourage side sleeping position and weight loss efforts.  Advised against driving if experiencing excessive daytime sleepiness fatigue.  Follow-up 1 to 2 weeks after sleep study review results and treatment options if needed.

## 2022-07-30 NOTE — Progress Notes (Signed)
Reviewed and agree with assessment/plan.   Kapono Luhn, MD [facility] 07/30/2022, 7:11 PM Pager:  [phone]  

## 2022-08-11 ENCOUNTER — Ambulatory Visit: Payer: 59 | Admitting: Primary Care

## 2022-08-11 DIAGNOSIS — G4733 Obstructive sleep apnea (adult) (pediatric): Secondary | ICD-10-CM

## 2022-08-11 DIAGNOSIS — R0683 Snoring: Secondary | ICD-10-CM

## 2022-08-19 DIAGNOSIS — G4733 Obstructive sleep apnea (adult) (pediatric): Secondary | ICD-10-CM | POA: Diagnosis not present

## 2022-08-19 NOTE — Progress Notes (Signed)
HST 08/11/22 showed moderate OSA, needs visit to review

## 2022-09-05 ENCOUNTER — Ambulatory Visit: Payer: 59 | Admitting: Primary Care

## 2022-09-05 ENCOUNTER — Encounter: Payer: Self-pay | Admitting: Primary Care

## 2022-09-05 VITALS — BP 160/90 | HR 76 | Temp 98.1°F | Ht 70.0 in | Wt 217.8 lb

## 2022-09-05 DIAGNOSIS — G4733 Obstructive sleep apnea (adult) (pediatric): Secondary | ICD-10-CM | POA: Insufficient documentation

## 2022-09-05 DIAGNOSIS — R0683 Snoring: Secondary | ICD-10-CM

## 2022-09-05 DIAGNOSIS — I10 Essential (primary) hypertension: Secondary | ICD-10-CM

## 2022-09-05 NOTE — Progress Notes (Signed)
$@Patients$  ID: Mike Hayes, male    DOB: Mar 15, 1966, 57 y.o.   MRN: [id number]  Chief Complaint  Patient presents with   Follow-up    Referring provider: Midge Minium, MD  HPI: 57 year old male, never smoked. PMH significant for HTN, asthma, allergic rhinitis, diabetes mellitus, vit D deficiency.   Previous [facility] pulmonary encounter:  07/29/2022 Patient presents today for sleep consult. He has symptoms loud snoring, witnessed apnea, non-restorative sleep and daytime sleepiness. He has woke himself up snoring. He takes no sleep aids. He sleeps on his side or back. Typical bed time 9-10pm. It takes him 10 mins to fall asleep. He wakes up 3-4 times a night. He starts his day at 4:40am if on dayshift. He gets 5-6 hours of sleep a night. He does not feel rested when he wakes up. Weight is up 10 lbs. He does not operate heavy machinery. Epworth 15. Denies narcolepsy, cataplexy, sleep walking.   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    09/05/2022- interim hx  Patient presents today to review sleep study results.  He is doing well today. He was seen on 07/29/22 for sleep consult. He has symptoms of snoring, witnessed apnea and daytime sleepiness. He primarily sleeps on his back. He still feels very tired during that day, catches himself at work close to dosing off. He will get up and walk around to stay awake. His blood pressure remains elevated, he is working on this with primary care. He took his medication this morning. Sleep study on 08/11/22 showed moderate OSA, average AHI 16.7/hr with Spo2 low 82% (average 93%). He is open starting CPAP therapy.    Allergies  Allergen Reactions   Exenatide Other (See Comments)    Got knots and rash.    Immunization History  Administered Date(s) Administered   Janssen (J&J) SARS-COV-2 Vaccination 03/11/2020   Tdap 02/26/2016    Past Medical History:  Diagnosis Date   Childhood  asthma    Diabetes mellitus    High blood pressure    Seasonal allergies    Sinus trouble     Tobacco History: Social History   Tobacco Use  Smoking Status Never   Passive exposure: Never  Smokeless Tobacco Never   Counseling given: Not Answered   Outpatient Medications Prior to Visit  Medication Sig Dispense Refill   Benralizumab ([name] PEN) 30 MG/ML SOAJ Inject 30 mg into the skin every 8 (eight) weeks.     carvedilol (COREG) 12.5 MG tablet Take 25 mg by mouth 2 (two) times daily.     furosemide (LASIX) 20 MG tablet Take 20 mg by mouth daily as needed.     glimepiride (AMARYL) 1 MG tablet Take 1 tablet by mouth 2 (two) times daily.      losartan (COZAAR) 100 MG tablet Take 100 mg by mouth daily.     metFORMIN (GLUCOPHAGE) 1000 MG tablet Take 1,000 mg by mouth 2 (two) times daily.     Multiple Vitamin (MULTIVITAMIN WITH MINERALS) TABS tablet Take 1 tablet by mouth daily.     RYBELSUS 7 MG TABS Take 1 tablet by mouth every morning.     sildenafil (VIAGRA) 100 MG tablet Take 100 mg by mouth as needed.     terbinafine (LAMISIL) 250 MG tablet Take 250 mg by mouth daily as needed.     VENTOLIN HFA 108 (90 Base) MCG/ACT inhaler INHALE 2 PUFFS BY MOUTH EVERY 4 TO 6  HOURS AS NEEDED 18 g 1   vitamin B-12 (CYANOCOBALAMIN) 500 MCG tablet Take 500 mcg by mouth daily.     No facility-administered medications prior to visit.   Review of Systems  Review of Systems  Constitutional:  Positive for fatigue.  HENT: Negative.    Respiratory:  Positive for apnea.   Cardiovascular: Negative.   Psychiatric/Behavioral:  Positive for sleep disturbance.    Physical Exam  BP (!) 160/90 (BP Location: Left Arm, Patient Position: Sitting, Cuff Size: Large)   Pulse 76   Temp 98.1 F (36.7 C) (Oral)   Ht 5' 10"$  (1.778 m)   Wt 217 lb 12.8 oz (98.8 kg)   SpO2 97%   BMI 31.25 kg/m  Physical Exam Constitutional:      Appearance: Normal appearance.  Cardiovascular:     Rate and Rhythm: Normal  rate and regular rhythm.  Pulmonary:     Effort: Pulmonary effort is normal.     Breath sounds: Normal breath sounds.  Musculoskeletal:        General: Normal range of motion.  Skin:    General: Skin is warm and dry.  Neurological:     General: No focal deficit present.     Mental Status: He is alert and oriented to person, place, and time. Mental status is at baseline.  Psychiatric:        Mood and Affect: Mood normal.        Behavior: Behavior normal.        Thought Content: Thought content normal.        Judgment: Judgment normal.      Lab Results:  CBC    Component Value Date/Time   WBC 4.7 09/27/2021 0824   RBC 5.27 09/27/2021 0824   HGB 15.3 09/27/2021 0824   HCT 44.1 09/27/2021 0824   PLT 122.0 (L) 09/27/2021 0824   MCV 83.8 09/27/2021 0824   MCHC 34.8 09/27/2021 0824   RDW 13.8 09/27/2021 0824   LYMPHSABS 1.6 09/27/2021 0824   MONOABS 0.4 09/27/2021 0824   EOSABS 0.0 09/27/2021 0824   BASOSABS 0.0 09/27/2021 0824    BMET    Component Value Date/Time   NA 141 09/27/2021 0824   K 3.7 09/27/2021 0824   CL 106 09/27/2021 0824   CO2 27 09/27/2021 0824   GLUCOSE 106 (H) 09/27/2021 0824   BUN 11 09/27/2021 0824   CREATININE 0.96 09/27/2021 0824   CALCIUM 10.3 09/27/2021 0824    BNP No results found for: "BNP"  ProBNP No results found for: "PROBNP"  Imaging: No results found.   Assessment & Plan:   Moderate obstructive sleep apnea - Patient has symptoms loud snoring, witnessed apnea and daytime sleepiness. Epworth score 15. HST on 08/11/22 showed moderate OSA, AHI 16.7/hr. Reviewed treatment options, he is open to starting CPAP. DME order placed for auto CPAP 5-15cm h20 with mask of choice. Advised patient aim to wear CPAP every night min 4-6 hours or longer. Encourage he focus on side sleeping position and advised against driving if experiencing excessive daytime sleepiness.   HTN (hypertension) - BP remains elevated; 160/90 - Maintained on Cozaar  14m and Coreg 244mtwice daily  - Following with PCP    ElMartyn EhrichNP 09/05/2022

## 2022-09-05 NOTE — Assessment & Plan Note (Signed)
-   BP remains elevated; 160/90 - Maintained on Cozaar 181m and Coreg 211mtwice daily  - Following with PCP

## 2022-09-05 NOTE — Assessment & Plan Note (Signed)
-   Patient has symptoms loud snoring, witnessed apnea and daytime sleepiness. Epworth score 15. HST on 08/11/22 showed moderate OSA, AHI 16.7/hr. Reviewed treatment options, he is open to starting CPAP. DME order placed for auto CPAP 5-15cm h20 with mask of choice. Advised patient aim to wear CPAP every night min 4-6 hours or longer. Encourage he focus on side sleeping position and advised against driving if experiencing excessive daytime sleepiness.

## 2022-09-05 NOTE — Patient Instructions (Addendum)
Sleep study showed evidence of moderate sleep apnea, you have on average 16 events an hour  Orders: Auto CPAP 5-15cm h20  Recommendations: Aim to wear Cpap every night 4-6 hours or longer Focus on side sleeping position Work on weight loss as able Do not drive if tired   Follow-up Please call to schedule CPAP compliance check 31-90 days after starting cpap therapy with Beth NP    CPAP and BIPAP Information CPAP and BIPAP are methods that use air pressure to keep your airways open and to help you breathe well. CPAP and BIPAP use different amounts of pressure. Your health care provider will tell you whether CPAP or BIPAP would be more helpful for you. CPAP stands for "continuous positive airway pressure." With CPAP, the amount of pressure stays the same while you breathe in (inhale) and out (exhale). BIPAP stands for "bi-level positive airway pressure." With BIPAP, the amount of pressure will be higher when you inhale and lower when you exhale. This allows you to take larger breaths. CPAP or BIPAP may be used in the hospital, or your health care provider may want you to use it at home. You may need to have a sleep study before your health care provider can order a machine for you to use at home. What are the advantages? CPAP or BIPAP can be helpful if you have: Sleep apnea. Chronic obstructive pulmonary disease (COPD). Heart failure. Medical conditions that cause muscle weakness, including muscular dystrophy or amyotrophic lateral sclerosis (ALS). Other problems that cause breathing to be shallow, weak, abnormal, or difficult. CPAP and BIPAP are most commonly used for obstructive sleep apnea (OSA) to keep the airways from collapsing when the muscles relax during sleep. What are the risks? Generally, this is a safe treatment. However, problems may occur, including: Irritated skin or skin sores if the mask does not fit properly. Dry or stuffy nose or nosebleeds. Dry mouth. Feeling gassy  or bloated. Sinus or lung infection if the equipment is not cleaned properly. When should CPAP or BIPAP be used? In most cases, the mask only needs to be worn during sleep. Generally, the mask needs to be worn throughout the night and during any daytime naps. People with certain medical conditions may also need to wear the mask at other times, such as when they are awake. Follow instructions from your health care provider about when to use the machine. What happens during CPAP or BIPAP?  Both CPAP and BIPAP are provided by a small machine with a flexible plastic tube that attaches to a plastic mask that you wear. Air is blown through the mask into your nose or mouth. The amount of pressure that is used to blow the air can be adjusted on the machine. Your health care provider will set the pressure setting and help you find the best mask for you. Tips for using the mask Because the mask needs to be snug, some people feel trapped or closed-in (claustrophobic) when first using the mask. If you feel this way, you may need to get used to the mask. One way to do this is to hold the mask loosely over your nose or mouth and then gradually apply the mask more snugly. You can also gradually increase the amount of time that you use the mask. Masks are available in various types and sizes. If your mask does not fit well, talk with your health care provider about getting a different one. Some common types of masks include: Full face masks,  which fit over the mouth and nose. Nasal masks, which fit over the nose. Nasal pillow or prong masks, which fit into the nostrils. If you are using a mask that fits over your nose and you tend to breathe through your mouth, a chin strap may be applied to help keep your mouth closed. Use a skin barrier to protect your skin as told by your health care provider. Some CPAP and BIPAP machines have alarms that may sound if the mask comes off or develops a leak. If you have trouble  with the mask, it is very important that you talk with your health care provider about finding a way to make the mask easier to tolerate. Do not stop using the mask. There could be a negative impact on your health if you stop using the mask. Tips for using the machine Place your CPAP or BIPAP machine on a secure table or stand near an electrical outlet. Know where the on/off switch is on the machine. Follow instructions from your health care provider about how to set the pressure on your machine and when you should use it. Do not eat or drink while the CPAP or BIPAP machine is on. Food or fluids could get pushed into your lungs by the pressure of the CPAP or BIPAP. For home use, CPAP and BIPAP machines can be rented or purchased through home health care companies. Many different brands of machines are available. Renting a machine before purchasing may help you find out which particular machine works well for you. Your health insurance company may also decide which machine you may get. Keep the CPAP or BIPAP machine and attachments clean. Ask your health care provider for specific instructions. Check the humidifier if you have a dry stuffy nose or nosebleeds. Make sure it is working correctly. Follow these instructions at home: Take over-the-counter and prescription medicines only as told by your health care provider. Ask if you can take sinus medicine if your sinuses are blocked. Do not use any products that contain nicotine or tobacco. These products include cigarettes, chewing tobacco, and vaping devices, such as e-cigarettes. If you need help quitting, ask your health care provider. Keep all follow-up visits. This is important. Contact a health care provider if: You have redness or pressure sores on your head, face, mouth, or nose from the mask or head gear. You have trouble using the CPAP or BIPAP machine. You cannot tolerate wearing the CPAP or BIPAP mask. Someone tells you that you snore even  when wearing your CPAP or BIPAP. Get help right away if: You have trouble breathing. You feel confused. Summary CPAP and BIPAP are methods that use air pressure to keep your airways open and to help you breathe well. If you have trouble with the mask, it is very important that you talk with your health care provider about finding a way to make the mask easier to tolerate. Do not stop using the mask. There could be a negative impact to your health if you stop using the mask. Follow instructions from your health care provider about when to use the machine. This information is not intended to replace advice given to you by your health care provider. Make sure you discuss any questions you have with your health care provider. Document Revised: 02/10/2021 Document Reviewed: 06/12/2020 Elsevier Patient Education  [employer].

## 2022-09-17 ENCOUNTER — Other Ambulatory Visit: Payer: Self-pay | Admitting: Primary Care

## 2022-10-13 ENCOUNTER — Telehealth: Payer: Self-pay | Admitting: Primary Care

## 2022-10-13 MED ORDER — PREDNISONE 10 MG PO TABS: ORAL_TABLET | ORAL | 0 refills | Status: DC

## 2022-10-13 NOTE — Telephone Encounter (Signed)
Patient stated he has a dry cough, did not want to come in and would like to know if the nurse or doctor could call something in for him to take.  He stated that OTC meds are not working.  Please advise and call patient to discuss further.  CB# [phone]

## 2022-10-13 NOTE — Telephone Encounter (Signed)
Called and spoke with patient. Patient stated he's had a dry cough for 3 days and he is wheezing some. Patient stated that he also has tightness in his chest. Patient wants to know if we can call him in something to the pharmacy for his cough.   BW, please advise.

## 2022-10-13 NOTE — Telephone Encounter (Signed)
Called and spoke with patient. He verbalized understanding. ? ?Nothing further needed at time of call.  ?

## 2022-10-13 NOTE — Telephone Encounter (Signed)
Yes- I'll send in prednisone taper for asthma symptoms. He can take delsym cough syrup twice daily for cough. Continue albuterol 2 puffs every 4-6 hours. Is he still taking frasera. Does he have a maintenance inhaler?

## 2022-10-17 ENCOUNTER — Telehealth: Payer: 59 | Admitting: Physician Assistant

## 2022-10-17 DIAGNOSIS — B9689 Other specified bacterial agents as the cause of diseases classified elsewhere: Secondary | ICD-10-CM | POA: Diagnosis not present

## 2022-10-17 DIAGNOSIS — J208 Acute bronchitis due to other specified organisms: Secondary | ICD-10-CM | POA: Diagnosis not present

## 2022-10-17 MED ORDER — BENZONATATE 100 MG PO CAPS
100.0000 mg | ORAL_CAPSULE | Freq: Three times a day (TID) | ORAL | 0 refills | Status: DC | PRN
Start: 2022-10-17 — End: 2022-11-15

## 2022-10-17 MED ORDER — AZITHROMYCIN 250 MG PO TABS: ORAL_TABLET | ORAL | 0 refills | Status: AC

## 2022-10-17 NOTE — Progress Notes (Signed)
E-Visit for Cough  We are sorry that you are not feeling well.  Here is how we plan to help!  Based on your presentation I believe you most likely have A cough due to bacteria.  When patients have a fever and a productive cough with a change in color or increased sputum production, we are concerned about bacterial bronchitis.  If left untreated it can progress to pneumonia.  If your symptoms do not improve with your treatment plan it is important that you contact your provider.   I have prescribed Azithromyin 250 mg: two tablets now and then one tablet daily for 4 additonal days    In addition you may use A prescription cough medication called Tessalon Perles 100mg . You may take 1-2 capsules every 8 hours as needed for your cough.   From your responses in the eVisit questionnaire you describe inflammation in the upper respiratory tract which is causing a significant cough.  This is commonly called Bronchitis and has four common causes:   Allergies Viral Infections Acid Reflux Bacterial Infection Allergies, viruses and acid reflux are treated by controlling symptoms or eliminating the cause. An example might be a cough caused by taking certain blood pressure medications. You stop the cough by changing the medication. Another example might be a cough caused by acid reflux. Controlling the reflux helps control the cough.  USE OF BRONCHODILATOR ("RESCUE") INHALERS: There is a risk from using your bronchodilator too frequently.  The risk is that over-reliance on a medication which only relaxes the muscles surrounding the breathing tubes can reduce the effectiveness of medications prescribed to reduce swelling and congestion of the tubes themselves.  Although you feel brief relief from the bronchodilator inhaler, your asthma may actually be worsening with the tubes becoming more swollen and filled with mucus.  This can delay other crucial treatments, such as oral steroid medications. If you need to use a  bronchodilator inhaler daily, several times per day, you should discuss this with your provider.  There are probably better treatments that could be used to keep your asthma under control.     HOME CARE Only take medications as instructed by your medical team. Complete the entire course of an antibiotic. Drink plenty of fluids and get plenty of rest. Avoid close contacts especially the very young and the elderly Cover your mouth if you cough or cough into your sleeve. Always remember to wash your hands A steam or ultrasonic humidifier can help congestion.   GET HELP RIGHT AWAY IF: You develop worsening fever. You become short of breath You cough up blood. Your symptoms persist after you have completed your treatment plan MAKE SURE YOU  Understand these instructions. Will watch your condition. Will get help right away if you are not doing well or get worse.    Thank you for choosing an e-visit.  Your e-visit answers were reviewed by a board certified advanced clinical practitioner to complete your personal care plan. Depending upon the condition, your plan could have included both over the counter or prescription medications.  Please review your pharmacy choice. Make sure the pharmacy is open so you can pick up prescription now. If there is a problem, you may contact your provider through [employer] and have the prescription routed to another pharmacy.  Your safety is important to [place]. If you have drug allergies check your prescription carefully.   For the next 24 hours you can use MyChart to ask questions about today's visit, request a non-urgent  call back, or ask for a work or school excuse. You will get an email in the next two days asking about your experience. I hope that your e-visit has been valuable and will speed your recovery.  I have spent 5 minutes in review of e-visit questionnaire, review and updating patient chart, medical decision making and response to patient.    Mar Daring, PA-C

## 2022-10-24 ENCOUNTER — Telehealth: Payer: Self-pay | Admitting: Primary Care

## 2022-10-24 MED ORDER — PREDNISONE 10 MG PO TABS: 10.0000 mg | ORAL_TABLET | Freq: Every day | ORAL | 0 refills | Status: DC

## 2022-10-24 MED ORDER — FLUTICASONE PROPIONATE 50 MCG/ACT NA SUSP: 1.0000 | Freq: Every day | NASAL | 2 refills | Status: DC

## 2022-10-24 MED ORDER — PREDNISONE 10 MG PO TABS: ORAL_TABLET | ORAL | 0 refills | Status: DC

## 2022-10-24 NOTE — Telephone Encounter (Signed)
Spoke to pt and he states that he has a dry cough (not producing anything) and wheezing had a round of prednisone but cough never went away. Is taking the benzonatate (tessalon) but not helping. Pt also stated he's not sleeping because of the cough. I informed pt I will send a message to his provider or doc of day. He verbalized understanding.

## 2022-10-24 NOTE — Telephone Encounter (Signed)
Called and clarified rx for pred taper with pharmacist  Nothing further needed

## 2022-10-24 NOTE — Telephone Encounter (Signed)
Patient still having symptoms of cough and wheezing. Pharmacy is [facility] [place]. Patient phone number is [phone].

## 2022-10-24 NOTE — Telephone Encounter (Signed)
Please confirm that patient is still taking [name] every 8 weeks for asthma. He can use albuterol as needed. Recommend using OTC zyrtec and using fluticasone nasal spray for seasonal allergies. As long as his blood sugar readings have been consistently <250 we can send in additional prednisone taper 50mg  x 3 days, 40mg  x 3 days, 30mg  x 3 days, 20mg  x 3 days, 10mg  x 3 days. Needs OV first available with Dr. Francine Graven or APP.

## 2022-10-24 NOTE — Telephone Encounter (Signed)
I called and spoke with the pt and notified of response per [facility]  Pt verbalized understanding  He says blood glucose readings have been below 250  He does still have his [name] every 8 wks  He is taking zyrtec already  I sent in rx for pred taper and flonase  Scheduled with Dr Francine Graven for 11/15/22 and advised seek emergent care if symptoms get worse  Call for sooner appt if needed

## 2022-10-26 ENCOUNTER — Ambulatory Visit: Payer: 59 | Admitting: Behavioral Health

## 2022-11-02 ENCOUNTER — Encounter: Payer: 59 | Admitting: Family Medicine

## 2022-11-15 ENCOUNTER — Encounter: Payer: Self-pay | Admitting: Pulmonary Disease

## 2022-11-15 ENCOUNTER — Ambulatory Visit: Payer: 59 | Admitting: Pulmonary Disease

## 2022-11-15 VITALS — BP 138/86 | HR 83 | Ht 70.0 in | Wt 210.0 lb

## 2022-11-15 DIAGNOSIS — J4540 Moderate persistent asthma, uncomplicated: Secondary | ICD-10-CM | POA: Diagnosis not present

## 2022-11-15 DIAGNOSIS — G4733 Obstructive sleep apnea (adult) (pediatric): Secondary | ICD-10-CM | POA: Diagnosis not present

## 2022-11-15 NOTE — Patient Instructions (Signed)
Continue fasenra injections  Continue with CPAP in the future through the [place]  Use albuterol inhaler as needed  If your prednisone need increases, then we will add a maintenance inhaler.   Follow up in 6 months

## 2022-11-15 NOTE — Progress Notes (Signed)
Synopsis: Referred in December 2022 for Moderate Persistent Asthma on [name], previous Dr. Kendrick Fries patient  Subjective:   PATIENT ID: Mike Hayes GENDER: male DOB: May 06, 1966, MRN: [id number]  HPI  Chief Complaint  Patient presents with   Follow-up    F/U after having bronchitis earlier this morning. Believes he developed bronchitis from the cpap machine and hasn't used it for the past 3 weeks.    Mike Hayes is a 57 year old male, never smoker with moderate persistent asthma and allergic rhinitis who returns to pulmonary clinic for follow up.   Last seen by by Buelah Manis 09/05/22 for OSA and ordered for CPAP. He was treated with prednisone taper at the end of March for increased cough and wheezing. An additional taper was sent in on 4/8 for on going cough. He is feeling much better. He is concerned the CPAP led to his bronchitis. He returned his machine due to insurance issues with coverage [facility]   OV 07/05/21 He has been doing well since last follow up visit in 09/2020. He has been on fasenra since 2019 which has been a Secretary/administrator for him. He rarely needs albuterol as needed. He continues to have issues with seasonal allergies in regards to rhinitis and congestion. He is using as needed sudafed. He is not using any nasal sprays.   He is a never smoker. He is a Midwife. 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  Past Medical History:  Diagnosis Date   Childhood asthma    Diabetes mellitus    High blood pressure    Seasonal allergies    Sinus trouble      Family History  Problem Relation Age of Onset   Allergies Mother    Asthma Mother    Diabetes Mellitus II Mother    COPD Mother    Cancer Paternal Grandmother    Cancer Maternal Grandmother        was a heavy smoker   Cancer Maternal Grandfather        was a heavy smoker     Social History   Socioeconomic History   Marital status: Married     Spouse name: Not on file   Number of children: 3   Years of education: Not on file   Highest education level: Not on file  Occupational History   Occupation: Arboriculturist    Comment: Software engineer  Tobacco Use   Smoking status: Never    Passive exposure: Never   Smokeless tobacco: Never  Substance and Sexual Activity   Alcohol use: Yes    Alcohol/week: 0.0 standard drinks of alcohol    Comment: socially   Drug use: No   Sexual activity: Not on file  Other Topics Concern   Not on file  Social History Narrative   Not on file   Social Determinants of Health   Financial Resource Strain: Not on file  Food Insecurity: Not on file  Transportation Needs: Not on file  Physical Activity: Not on file  Stress: Not on file  Social Connections: Not on file  Intimate Partner Violence: Not on file     Allergies  Allergen Reactions   Exenatide Other (See Comments)    Got knots and rash.     Outpatient Medications Prior to Visit  Medication Sig Dispense Refill   albuterol (VENTOLIN HFA) 108 (90 Base) MCG/ACT inhaler INHALE 2 PUFFS BY MOUTH EVERY  4 TO 6 HOURS AS NEEDED 9 g 0   Benralizumab ([name] PEN) 30 MG/ML SOAJ Inject 30 mg into the skin every 8 (eight) weeks.     carvedilol (COREG) 12.5 MG tablet Take 25 mg by mouth 2 (two) times daily. 2.5 tablets twice daily     fluticasone (FLONASE) 50 MCG/ACT nasal spray Place 1 spray into both nostrils daily. 16 g 2   furosemide (LASIX) 20 MG tablet Take 20 mg by mouth daily as needed.     glimepiride (AMARYL) 1 MG tablet Take 1 tablet by mouth 2 (two) times daily.      losartan (COZAAR) 100 MG tablet Take 100 mg by mouth daily.     metFORMIN (GLUCOPHAGE) 1000 MG tablet Take 1,000 mg by mouth 2 (two) times daily.     Multiple Vitamin (MULTIVITAMIN WITH MINERALS) TABS tablet Take 1 tablet by mouth daily.     Semaglutide,0.25 or 0.5MG /DOS, 2 MG/3ML SOPN INJECT 0.5MG  SUBCUTANEOUSLY ONCE WEEKLY FOR DIABETES *APPROVED*     tadalafil  (CIALIS) 10 MG tablet Take 10 mg by mouth daily as needed.     terbinafine (LAMISIL) 250 MG tablet Take 250 mg by mouth daily as needed.     vitamin B-12 (CYANOCOBALAMIN) 500 MCG tablet Take 500 mcg by mouth daily.     benzonatate (TESSALON) 100 MG capsule Take 1 capsule (100 mg total) by mouth 3 (three) times daily as needed. 30 capsule 0   predniSONE (DELTASONE) 10 MG tablet 4 tabs for 2 days, then 3 tabs for 2 days, 2 tabs for 2 days, then 1 tab for 2 days, then stop 20 tablet 0   predniSONE (DELTASONE) 10 MG tablet Take 1 tablet (10 mg total) by mouth daily with breakfast. 45 tablet 0   predniSONE (DELTASONE) 10 MG tablet 5 x 3 days, 4 x 3 days, 3 x 3 days, 2 x 3 days, 1 x 3 days, then stop 45 tablet 0   RYBELSUS 7 MG TABS Take 1 tablet by mouth every morning.     sildenafil (VIAGRA) 100 MG tablet Take 100 mg by mouth as needed.     No facility-administered medications prior to visit.   Review of Systems  Constitutional:  Negative for chills, fever, malaise/fatigue and weight loss.  HENT:  Negative for congestion, sinus pain and sore throat.   Eyes: Negative.   Respiratory:  Negative for cough, hemoptysis, sputum production, shortness of breath and wheezing.   Cardiovascular:  Negative for chest pain, palpitations, orthopnea, claudication and leg swelling.  Gastrointestinal:  Negative for abdominal pain, heartburn, nausea and vomiting.  Genitourinary: Negative.   Musculoskeletal:  Negative for joint pain and myalgias.  Skin:  Negative for rash.   Objective:   Vitals:   11/15/22 1119  BP: 138/86  Pulse: 83  SpO2: 98%  Weight: 210 lb (95.3 kg)  Height: 5\' 10"  (1.778 m)     Physical Exam Constitutional:      General: He is not in acute distress. HENT:     Head: Normocephalic and atraumatic.  Eyes:     Conjunctiva/sclera: Conjunctivae normal.  Cardiovascular:     Rate and Rhythm: Normal rate and regular rhythm.     Pulses: Normal pulses.     Heart sounds: Normal heart  sounds. No murmur heard. Pulmonary:     Effort: Pulmonary effort is normal.     Breath sounds: Normal breath sounds.  Musculoskeletal:     Right lower leg: No edema.  Left lower leg: No edema.  Skin:    General: Skin is warm and dry.  Neurological:     General: No focal deficit present.     Mental Status: He is alert.    CBC    Component Value Date/Time   WBC 4.7 09/27/2021 0824   RBC 5.27 09/27/2021 0824   HGB 15.3 09/27/2021 0824   HCT 44.1 09/27/2021 0824   PLT 122.0 (L) 09/27/2021 0824   MCV 83.8 09/27/2021 0824   MCHC 34.8 09/27/2021 0824   RDW 13.8 09/27/2021 0824   LYMPHSABS 1.6 09/27/2021 0824   MONOABS 0.4 09/27/2021 0824   EOSABS 0.0 09/27/2021 0824   BASOSABS 0.0 09/27/2021 0824   Chest imaging: CXR 10/17/17 Lungs are clear. The heart size and pulmonary vascularity are normal. No adenopathy. There is mild anterior wedging of two lower thoracic vertebral bodies, stable.  PFT:    Latest Ref Rng & Units 09/24/2015    2:30 PM  PFT Results  FVC-Pre L 4.68   FVC-Predicted Pre % 93   FVC-Post L 4.82   FVC-Predicted Post % 96   Pre FEV1/FVC % % 77   Post FEV1/FCV % % 78   FEV1-Pre L 3.59   FEV1-Predicted Pre % 92   FEV1-Post L 3.78   DLCO uncorrected ml/min/mmHg 44.27   DLCO UNC% % 139   DLCO corrected ml/min/mmHg 42.67   DLCO COR %Predicted % 134   DLVA Predicted % 143   TLC L 7.10   TLC % Predicted % 103   RV % Predicted % 71    Labs:  Path:  Echo:  Heart Catheterization:    Assessment & Plan:   Moderate persistent asthma, unspecified whether complicated  Moderate obstructive sleep apnea  Discussion: Mike Hayes is a 57 year old male, never smoker with moderate persistent asthma and allergic rhinitis who returns to pulmonary clinic for follow up.   He is to continue on fasenra every 8 weeks for asthma. He can use albuterol as needed.   He is starting CPAP through the [place] in the near future. He appears to have uncontrolled hypertension  which should improved once CPAP is started. If he does not tolerate CPAP, we will refer for an oral device.   Follow up in 6 months.   Melody Comas, MD [facility] Office: [phone]   See Amion for personal pager PCCM on call pager [phone] until 7pm. Please call Elink 7p-7a. [phone]     Current Outpatient Medications:    albuterol (VENTOLIN HFA) 108 (90 Base) MCG/ACT inhaler, INHALE 2 PUFFS BY MOUTH EVERY 4 TO 6 HOURS AS NEEDED, Disp: 9 g, Rfl: 0   Benralizumab ([name] PEN) 30 MG/ML SOAJ, Inject 30 mg into the skin every 8 (eight) weeks., Disp: , Rfl:    carvedilol (COREG) 12.5 MG tablet, Take 25 mg by mouth 2 (two) times daily. 2.5 tablets twice daily, Disp: , Rfl:    fluticasone (FLONASE) 50 MCG/ACT nasal spray, Place 1 spray into both nostrils daily., Disp: 16 g, Rfl: 2   furosemide (LASIX) 20 MG tablet, Take 20 mg by mouth daily as needed., Disp: , Rfl:    glimepiride (AMARYL) 1 MG tablet, Take 1 tablet by mouth 2 (two) times daily. , Disp: , Rfl:    losartan (COZAAR) 100 MG tablet, Take 100 mg by mouth daily., Disp: , Rfl:    metFORMIN (GLUCOPHAGE) 1000 MG tablet, Take 1,000 mg by mouth 2 (two) times daily., Disp: ,  Rfl:    Multiple Vitamin (MULTIVITAMIN WITH MINERALS) TABS tablet, Take 1 tablet by mouth daily., Disp: , Rfl:    Semaglutide,0.25 or 0.5MG /DOS, 2 MG/3ML SOPN, INJECT 0.5MG  SUBCUTANEOUSLY ONCE WEEKLY FOR DIABETES *APPROVED*, Disp: , Rfl:    tadalafil (CIALIS) 10 MG tablet, Take 10 mg by mouth daily as needed., Disp: , Rfl:    terbinafine (LAMISIL) 250 MG tablet, Take 250 mg by mouth daily as needed., Disp: , Rfl:    vitamin B-12 (CYANOCOBALAMIN) 500 MCG tablet, Take 500 mcg by mouth daily., Disp: , Rfl:

## 2022-11-21 ENCOUNTER — Other Ambulatory Visit: Payer: Self-pay | Admitting: Primary Care

## 2022-11-22 NOTE — Telephone Encounter (Signed)
Refill sent for [facility] to [facility]: [phone]   Dose: 30 mg SQ every 8 weeks  Last OV: 11/15/2022 Provider: Dr. Francine Graven  Next OV: 6 months (Nov 2024) - not scheduled yet  Chesley Mires, PharmD, MPH, BCPS Clinical Pharmacist (Rheumatology and Pulmonology)

## 2022-12-21 ENCOUNTER — Ambulatory Visit (INDEPENDENT_AMBULATORY_CARE_PROVIDER_SITE_OTHER): Payer: 59 | Admitting: Family Medicine

## 2022-12-21 ENCOUNTER — Encounter: Payer: Self-pay | Admitting: Family Medicine

## 2022-12-21 VITALS — BP 124/82 | HR 81 | Temp 98.6°F | Resp 18 | Ht 70.0 in | Wt 210.2 lb

## 2022-12-21 DIAGNOSIS — Z0000 Encounter for general adult medical examination without abnormal findings: Secondary | ICD-10-CM | POA: Diagnosis not present

## 2022-12-21 DIAGNOSIS — E559 Vitamin D deficiency, unspecified: Secondary | ICD-10-CM

## 2022-12-21 DIAGNOSIS — E119 Type 2 diabetes mellitus without complications: Secondary | ICD-10-CM | POA: Diagnosis not present

## 2022-12-21 DIAGNOSIS — G4733 Obstructive sleep apnea (adult) (pediatric): Secondary | ICD-10-CM | POA: Diagnosis not present

## 2022-12-21 LAB — MICROALBUMIN / CREATININE URINE RATIO
Creatinine,U: 142.2 mg/dL
Microalb Creat Ratio: 0.7 mg/g (ref 0.0–30.0)
Microalb, Ur: 1 mg/dL (ref 0.0–1.9)

## 2022-12-21 NOTE — Patient Instructions (Signed)
Follow up in 6 months to recheck BP, cholesterol, sugar We'll notify you of your lab results and make any changes if needed Continue to work on healthy diet and regular exercise- you can do it! Call with any questions or concerns Stay Safe!  Stay Healthy! Have a great summer!!!

## 2022-12-21 NOTE — Assessment & Plan Note (Signed)
Pt was dx'd and managed through the [place].

## 2022-12-21 NOTE — Progress Notes (Signed)
   Subjective:    Patient ID: Mike Hayes, male    DOB: [birth year], 57 y.o.   MRN: [id number]  HPI CPE- UTD on colonoscopy, Tdap.  Eye exam is scheduled.  Due for foot exam today.  Due for microalbumin.  Patient Care Team    Relationship Specialty Notifications Start End  Sheliah Hatch, MD PCP - General Family Medicine  04/06/21     Health Maintenance  Topic Date Due   Diabetic kidney evaluation - Urine ACR  Never done   HEMOGLOBIN A1C  03/30/2022   OPHTHALMOLOGY EXAM  05/03/2022   Diabetic kidney evaluation - eGFR measurement  09/28/2022   INFLUENZA VACCINE  02/16/2023   FOOT EXAM  12/21/2023   DTaP/Tdap/Td (3 - Td or Tdap) 02/25/2026   Colonoscopy  08/09/2026   HPV VACCINES  Aged Out   COVID-19 Vaccine  Discontinued   Hepatitis C Screening  Discontinued   HIV Screening  Discontinued   Zoster Vaccines- Shingrix  Discontinued     Review of Systems Patient reports no vision/hearing changes, anorexia, fever ,adenopathy, persistant/recurrent hoarseness, swallowing issues, chest pain, palpitations, edema, persistant/recurrent cough, hemoptysis, dyspnea (rest,exertional, paroxysmal nocturnal), gastrointestinal  bleeding (melena, rectal bleeding), abdominal pain, excessive heart burn, GU symptoms (dysuria, hematuria, voiding/incontinence issues) syncope, focal weakness, memory loss, numbness & tingling, skin/hair/nail changes, depression, anxiety, abnormal bruising/bleeding, musculoskeletal symptoms/signs.     Objective:   Physical Exam General Appearance:    Alert, cooperative, no distress, appears stated age  Head:    Normocephalic, without obvious abnormality, atraumatic  Eyes:    PERRL, conjunctiva/corneas clear, EOM's intact both eyes       Ears:    Normal TM's and external ear canals, both ears  Nose:   Nares normal, septum midline, mucosa normal, no drainage   or sinus tenderness  Throat:   Lips, mucosa, and tongue normal; teeth and gums normal  Neck:   Supple,  symmetrical, trachea midline, no adenopathy;       thyroid:  No enlargement/tenderness/nodules  Back:     Symmetric, no curvature, ROM normal, no CVA tenderness  Lungs:     Clear to auscultation bilaterally, respirations unlabored  Chest wall:    No tenderness or deformity  Heart:    Regular rate and rhythm, S1 and S2 normal, no murmur, rub   or gallop  Abdomen:     Soft, non-tender, bowel sounds active all four quadrants,    no masses, no organomegaly  Genitalia:    Deferred  Rectal:    Extremities:   Extremities normal, atraumatic, no cyanosis or edema  Pulses:   2+ and symmetric all extremities  Skin:   Skin color, texture, turgor normal, no rashes or lesions  Lymph nodes:   Cervical, supraclavicular, and axillary nodes normal  Neurologic:   CNII-XII intact. Normal strength, sensation and reflexes      throughout          Assessment & Plan:

## 2022-12-22 ENCOUNTER — Telehealth: Payer: Self-pay

## 2022-12-22 LAB — CBC WITH DIFFERENTIAL/PLATELET
Basophils Absolute: 0 10*3/uL (ref 0.0–0.1)
Basophils Relative: 0.2 % (ref 0.0–3.0)
Eosinophils Absolute: 0 10*3/uL (ref 0.0–0.7)
Eosinophils Relative: 0.1 % (ref 0.0–5.0)
HCT: 41.5 % (ref 39.0–52.0)
Hemoglobin: 14.1 g/dL (ref 13.0–17.0)
Lymphocytes Relative: 36.3 % (ref 12.0–46.0)
Lymphs Abs: 1.8 10*3/uL (ref 0.7–4.0)
MCHC: 34 g/dL (ref 30.0–36.0)
MCV: 85.1 fl (ref 78.0–100.0)
Monocytes Absolute: 0.4 10*3/uL (ref 0.1–1.0)
Monocytes Relative: 8.5 % (ref 3.0–12.0)
Neutro Abs: 2.7 10*3/uL (ref 1.4–7.7)
Neutrophils Relative %: 54.9 % (ref 43.0–77.0)
Platelets: 130 10*3/uL — ABNORMAL LOW (ref 150.0–400.0)
RBC: 4.88 Mil/uL (ref 4.22–5.81)
RDW: 13.6 % (ref 11.5–15.5)
WBC: 4.8 10*3/uL (ref 4.0–10.5)

## 2022-12-22 LAB — BASIC METABOLIC PANEL
BUN: 12 mg/dL (ref 6–23)
CO2: 30 mEq/L (ref 19–32)
Calcium: 10.1 mg/dL (ref 8.4–10.5)
Chloride: 105 mEq/L (ref 96–112)
Creatinine, Ser: 1.03 mg/dL (ref 0.40–1.50)
GFR: 81.03 mL/min (ref >60.00)
Glucose, Bld: 157 mg/dL — ABNORMAL HIGH (ref 70–99)
Potassium: 4 mEq/L (ref 3.5–5.1)
Sodium: 142 mEq/L (ref 135–145)

## 2022-12-22 LAB — LIPID PANEL
Cholesterol: 75 mg/dL (ref 0–200)
HDL: 50.6 mg/dL (ref >39.00)
LDL Cholesterol: 5 mg/dL (ref 0–99)
NonHDL: 24.89
Total CHOL/HDL Ratio: 1
Triglycerides: 98 mg/dL (ref 0.0–149.0)
VLDL: 19.6 mg/dL (ref 0.0–40.0)

## 2022-12-22 LAB — HEMOGLOBIN A1C: Hgb A1c MFr Bld: 7 % — ABNORMAL HIGH (ref 4.6–6.5)

## 2022-12-22 LAB — TSH: TSH: 0.65 u[IU]/mL (ref 0.35–5.50)

## 2022-12-22 LAB — HEPATIC FUNCTION PANEL
ALT: 20 U/L (ref 0–53)
AST: 20 U/L (ref 0–37)
Albumin: 4.2 g/dL (ref 3.5–5.2)
Alkaline Phosphatase: 45 U/L (ref 39–117)
Bilirubin, Direct: 0.2 mg/dL (ref 0.0–0.3)
Total Bilirubin: 0.9 mg/dL (ref 0.2–1.2)
Total Protein: 6.8 g/dL (ref 6.0–8.3)

## 2022-12-22 LAB — VITAMIN D 25 HYDROXY (VIT D DEFICIENCY, FRACTURES): VITD: 44.19 ng/mL (ref 30.00–100.00)

## 2022-12-22 NOTE — Telephone Encounter (Signed)
-----   Message from Sheliah Hatch, MD sent at 12/22/2022  1:29 PM EDT ----- Labs look good!  No changes at this time

## 2022-12-27 NOTE — Assessment & Plan Note (Signed)
Check microalbumin.

## 2022-12-27 NOTE — Assessment & Plan Note (Signed)
Check labs and replete prn. 

## 2022-12-27 NOTE — Assessment & Plan Note (Signed)
Pt's PE WNL w/ exception of BMI.  UTD on colonoscopy, Tdap.  Eye exam is scheduled.  Check labs.  Anticipatory guidance provided.

## 2023-02-07 ENCOUNTER — Encounter: Payer: Self-pay | Admitting: Family Medicine

## 2023-04-24 ENCOUNTER — Telehealth: Payer: Self-pay | Admitting: Pulmonary Disease

## 2023-04-24 DIAGNOSIS — J4540 Moderate persistent asthma, uncomplicated: Secondary | ICD-10-CM

## 2023-05-01 NOTE — Telephone Encounter (Signed)
Refill sent for [facility] to [facility]: [phone]   Dose: 30mg  SQ every 8 weeks  Last OV: 11/15/2022 Provider: Dr. Francine Graven  Next OV: due this month.  Routing to scheduling team for follow-up on appt scheduling  Chesley Mires, PharmD, MPH, BCPS Clinical Pharmacist (Rheumatology and Pulmonology)

## 2023-05-04 ENCOUNTER — Telehealth: Payer: Self-pay | Admitting: Pharmacist

## 2023-05-04 NOTE — Telephone Encounter (Signed)
Submitted a Prior Authorization renewal request to [facility] for [facility] via CoverMyMeds. Will update once we receive a response.  Key: [id number]

## 2023-05-04 NOTE — Telephone Encounter (Signed)
Received notification from [facility] regarding a prior authorization for [facility]. Authorization has been APPROVED from 05/04/23 to 05/03/2024. Approval letter sent to scan center.  Patient must continue to fill through [facility]: [phone]   Authorization # [id number] Phone # [phone]  Chesley Mires, PharmD, MPH, BCPS, CPP Clinical Pharmacist (Rheumatology and Pulmonology)

## 2023-06-26 ENCOUNTER — Telehealth: Payer: Self-pay | Admitting: *Deleted

## 2023-06-26 NOTE — Telephone Encounter (Signed)
Called and spoke with patient, he states he sent the CPAP machine back that we got for him through a DME company because they were going to charge him $200 per month for it.  He got a machine through the [place] for free.  He cannot wear it and has tried all the different masks.  He has not been wearing it recently.  He sleeps on his back, he tries to sleep on his side, but wakes up on his back.  States he sleeps better without the machine.  Nothing further needed.

## 2023-06-27 ENCOUNTER — Encounter: Payer: Self-pay | Admitting: Pulmonary Disease

## 2023-06-27 ENCOUNTER — Ambulatory Visit: Payer: 59 | Admitting: Pulmonary Disease

## 2023-06-27 VITALS — BP 162/90 | HR 71 | Ht 70.0 in | Wt 216.0 lb

## 2023-06-27 DIAGNOSIS — G4733 Obstructive sleep apnea (adult) (pediatric): Secondary | ICD-10-CM

## 2023-06-27 DIAGNOSIS — J4540 Moderate persistent asthma, uncomplicated: Secondary | ICD-10-CM

## 2023-06-27 NOTE — Progress Notes (Signed)
Synopsis: Referred in December 2022 for Moderate Persistent Asthma on [name], previous Dr. Kendrick Fries patient  Subjective:   PATIENT ID: Mike Hayes GENDER: male DOB: 1965-09-11, MRN: [id number]  HPI  Chief Complaint  Patient presents with   Follow-up    Pt denies any concerns, states he has been using inhalers. Still on fasnera and asthma has been under control    Mike Hayes is a 57 year old male, never smoker with moderate persistent asthma and allergic rhinitis who returns to pulmonary clinic for follow up.   He presents with fluctuating blood pressure despite being on carvedilol and losartan. He reports that his blood pressure sometimes reaches 160. He also mentions that he has been unable to tolerate his CPAP machine, which was provided by the [facility], due to discomfort and a feeling of suffocation. He has tried various masks but none have been successful. He also mentions that he works rotating shifts, which may be contributing to his sleep issues.  In terms of his asthma, he reports that he has not had an episode since the last time he used his CPAP machine. He has been managing his asthma with [name] injections and an inhaler, which he has not needed to use recently.  OV 11/15/22 Last seen by by Buelah Manis 09/05/22 for OSA and ordered for CPAP. He was treated with prednisone taper at the end of March for increased cough and wheezing. An additional taper was sent in on 4/8 for on going cough. He is feeling much better. He is concerned the CPAP led to his bronchitis. He returned his machine due to insurance issues with coverage [facility].   OV 07/05/21 He has been doing well since last follow up visit in 09/2020. He has been on fasenra since 2019 which has been a Secretary/administrator for him. He rarely needs albuterol as needed. He continues to have issues with seasonal allergies in regards to rhinitis and congestion. He is using as needed  sudafed. He is not using any nasal sprays.   He is a never smoker. He is a Midwife, retired. 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  Past Medical History:  Diagnosis Date   Childhood asthma    Diabetes mellitus    High blood pressure    Seasonal allergies    Sinus trouble      Family History  Problem Relation Age of Onset   Allergies Mother    Asthma Mother    Diabetes Mellitus II Mother    COPD Mother    Cancer Paternal Grandmother    Cancer Maternal Grandmother        was a heavy smoker   Cancer Maternal Grandfather        was a heavy smoker     Social History   Socioeconomic History   Marital status: Married    Spouse name: Not on file   Number of children: 3   Years of education: Not on file   Highest education level: Not on file  Occupational History   Occupation: Arboriculturist    Comment: Software engineer  Tobacco Use   Smoking status: Never    Passive exposure: Never   Smokeless tobacco: Never  Substance and Sexual Activity   Alcohol use: Yes    Alcohol/week: 0.0 standard drinks of alcohol    Comment: socially   Drug use: No   Sexual activity: Not on file  Other  Topics Concern   Not on file  Social History Narrative   Not on file   Social Determinants of Health   Financial Resource Strain: Not on file  Food Insecurity: Not on file  Transportation Needs: Not on file  Physical Activity: Not on file  Stress: Not on file  Social Connections: Not on file  Intimate Partner Violence: Not on file     Allergies  Allergen Reactions   Exenatide Other (See Comments)    Got knots and rash.     Outpatient Medications Prior to Visit  Medication Sig Dispense Refill   albuterol (VENTOLIN HFA) 108 (90 Base) MCG/ACT inhaler INHALE 2 PUFFS BY MOUTH EVERY 4 TO 6 HOURS AS NEEDED 9 g 0   benralizumab ([name] PEN) 30 MG/ML prefilled autoinjector Inject 1 mL (30 mg total) into the skin every 8 (eight) weeks. **appt needed** 1 mL 0   carvedilol (COREG)  12.5 MG tablet Take 25 mg by mouth 2 (two) times daily. 2.5 tablets twice daily     furosemide (LASIX) 20 MG tablet Take 20 mg by mouth daily as needed.     glimepiride (AMARYL) 1 MG tablet Take 1 tablet by mouth 2 (two) times daily.      losartan (COZAAR) 100 MG tablet Take 100 mg by mouth daily.     metFORMIN (GLUCOPHAGE) 1000 MG tablet Take 1,000 mg by mouth 2 (two) times daily.     Multiple Vitamin (MULTIVITAMIN WITH MINERALS) TABS tablet Take 1 tablet by mouth daily.     Semaglutide,0.25 or 0.5MG /DOS, 2 MG/3ML SOPN INJECT 0.5MG  SUBCUTANEOUSLY ONCE WEEKLY FOR DIABETES *APPROVED*     tadalafil (CIALIS) 10 MG tablet Take 10 mg by mouth daily as needed.     terbinafine (LAMISIL) 250 MG tablet Take 250 mg by mouth daily as needed.     vitamin B-12 (CYANOCOBALAMIN) 500 MCG tablet Take 500 mcg by mouth daily.     No facility-administered medications prior to visit.   Review of Systems  Constitutional:  Negative for chills, fever, malaise/fatigue and weight loss.  HENT:  Negative for congestion, sinus pain and sore throat.   Eyes: Negative.   Respiratory:  Negative for cough, hemoptysis, sputum production, shortness of breath and wheezing.   Cardiovascular:  Negative for chest pain, palpitations, orthopnea, claudication and leg swelling.  Gastrointestinal:  Negative for abdominal pain, heartburn, nausea and vomiting.  Genitourinary: Negative.   Musculoskeletal:  Negative for joint pain and myalgias.  Skin:  Negative for rash.   Objective:   Vitals:   06/27/23 1314  BP: (!) 162/90  Pulse: 71  SpO2: 96%  Weight: 216 lb (98 kg)  Height: 5\' 10"  (1.778 m)      Physical Exam Constitutional:      General: He is not in acute distress. HENT:     Head: Normocephalic and atraumatic.  Eyes:     Conjunctiva/sclera: Conjunctivae normal.  Cardiovascular:     Rate and Rhythm: Normal rate and regular rhythm.     Pulses: Normal pulses.     Heart sounds: Normal heart sounds. No murmur  heard. Pulmonary:     Effort: Pulmonary effort is normal.     Breath sounds: Normal breath sounds.  Musculoskeletal:     Right lower leg: No edema.     Left lower leg: No edema.  Skin:    General: Skin is warm and dry.  Neurological:     General: No focal deficit present.     Mental Status: He  is alert.    CBC    Component Value Date/Time   WBC 4.8 12/21/2022 1418   RBC 4.88 12/21/2022 1418   HGB 14.1 12/21/2022 1418   HCT 41.5 12/21/2022 1418   PLT 130.0 (L) 12/21/2022 1418   MCV 85.1 12/21/2022 1418   MCHC 34.0 12/21/2022 1418   RDW 13.6 12/21/2022 1418   LYMPHSABS 1.8 12/21/2022 1418   MONOABS 0.4 12/21/2022 1418   EOSABS 0.0 12/21/2022 1418   BASOSABS 0.0 12/21/2022 1418   Chest imaging: CXR 10/17/17 Lungs are clear. The heart size and pulmonary vascularity are normal. No adenopathy. There is mild anterior wedging of two lower thoracic vertebral bodies, stable.  PFT:    Latest Ref Rng & Units 09/24/2015    2:30 PM  PFT Results  FVC-Pre L 4.68   FVC-Predicted Pre % 93   FVC-Post L 4.82   FVC-Predicted Post % 96   Pre FEV1/FVC % % 77   Post FEV1/FCV % % 78   FEV1-Pre L 3.59   FEV1-Predicted Pre % 92   FEV1-Post L 3.78   DLCO uncorrected ml/min/mmHg 44.27   DLCO UNC% % 139   DLCO corrected ml/min/mmHg 42.67   DLCO COR %Predicted % 134   DLVA Predicted % 143   TLC L 7.10   TLC % Predicted % 103   RV % Predicted % 71    Labs:  Path:  Echo:  Heart Catheterization:    Assessment & Plan:   No diagnosis found.  Discussion: Mike Hayes is a 57 year old male, never smoker with moderate persistent asthma and allergic rhinitis who returns to pulmonary clinic for follow up.   Asthma Stable on [name] injections. No recent exacerbations or need for rescue inhaler use. -Continue [name] injections. -Order pulmonary function tests to assess current lung function.  Obstructive Sleep Apnea Non-compliant with CPAP therapy due to discomfort with mask and  pressure settings. -Discussed potential for Inspire device as an alternative treatment. Provided patient with information to consider. -Consider referral to ENT for Inspire device evaluation if blood pressure control continues to be an issue.  Hypertension Fluctuating blood pressure despite treatment with Carvedilol and Losartan. -Continue Carvedilol twice daily and Losartan once daily. -Monitor blood pressure closely, consider impact of untreated sleep apnea on blood pressure control.  Follow-up in 6 months with PFTs  Melody Comas, MD [facility] Office: [phone]   See Amion for personal pager PCCM on call pager [phone] until 7pm. Please call Elink 7p-7a. [phone]     Current Outpatient Medications:    albuterol (VENTOLIN HFA) 108 (90 Base) MCG/ACT inhaler, INHALE 2 PUFFS BY MOUTH EVERY 4 TO 6 HOURS AS NEEDED, Disp: 9 g, Rfl: 0   benralizumab ([name] PEN) 30 MG/ML prefilled autoinjector, Inject 1 mL (30 mg total) into the skin every 8 (eight) weeks. **appt needed**, Disp: 1 mL, Rfl: 0   carvedilol (COREG) 12.5 MG tablet, Take 25 mg by mouth 2 (two) times daily. 2.5 tablets twice daily, Disp: , Rfl:    furosemide (LASIX) 20 MG tablet, Take 20 mg by mouth daily as needed., Disp: , Rfl:    glimepiride (AMARYL) 1 MG tablet, Take 1 tablet by mouth 2 (two) times daily. , Disp: , Rfl:    losartan (COZAAR) 100 MG tablet, Take 100 mg by mouth daily., Disp: , Rfl:    metFORMIN (GLUCOPHAGE) 1000 MG tablet, Take 1,000 mg by mouth 2 (two) times daily., Disp: , Rfl:  Multiple Vitamin (MULTIVITAMIN WITH MINERALS) TABS tablet, Take 1 tablet by mouth daily., Disp: , Rfl:    Semaglutide,0.25 or 0.5MG /DOS, 2 MG/3ML SOPN, INJECT 0.5MG  SUBCUTANEOUSLY ONCE WEEKLY FOR DIABETES *APPROVED*, Disp: , Rfl:    tadalafil (CIALIS) 10 MG tablet, Take 10 mg by mouth daily as needed., Disp: , Rfl:    terbinafine (LAMISIL) 250 MG tablet, Take 250 mg by mouth daily as  needed., Disp: , Rfl:    vitamin B-12 (CYANOCOBALAMIN) 500 MCG tablet, Take 500 mcg by mouth daily., Disp: , Rfl:

## 2023-06-27 NOTE — Patient Instructions (Addendum)
Continue fasenra every 8 weeks  Use albuterol inhaler 1-2 puffs every 4-6 hours as needed  Consider the Inspire Device for your sleep apnea if high blood pressure continues to be an issue [email or web address]  Follow up in 6 months with pulmonary function tests

## 2023-08-01 ENCOUNTER — Other Ambulatory Visit: Payer: Self-pay | Admitting: Pulmonary Disease

## 2023-08-01 DIAGNOSIS — J4540 Moderate persistent asthma, uncomplicated: Secondary | ICD-10-CM

## 2023-08-01 MED ORDER — FASENRA PEN 30 MG/ML ~~LOC~~ SOAJ: 30.0000 mg | SUBCUTANEOUS | 1 refills | Status: DC

## 2023-08-01 NOTE — Addendum Note (Signed)
 Addended by: Murrell Redden on: 08/01/2023 01:10 PM   Modules accepted: Orders

## 2023-10-24 ENCOUNTER — Ambulatory Visit: Payer: Self-pay | Admitting: *Deleted

## 2023-10-24 NOTE — Telephone Encounter (Signed)
  Chief Complaint: right low back pain after lifting generator  Symptoms: right low back pain started after possibly picking up generator 3-4 weeks ago . Dull pain not constant. Pain catches at times with movement  Frequency: 3-4 weeks  Pertinent Negatives: Patient denies severe pain no fever, no pain with urination  Disposition: [] ED /[] Urgent Care (no appt availability in office) / [x] Appointment(In office/virtual)/ []  [facility]/ [] Home Care/ [] Refused Recommended Disposition /[] [place] [facility]/ []  Follow-up with PCP Additional Notes:     No available appt with PCP. Scheduled appt with other provider in office 10/30/23 on patient day off.  Please advise.        Copied from CRM [id number]. Topic: Clinical - Red Word Triage >> Oct 24, 2023  8:06 AM Gurney Maxin H wrote: [facility] that prompted transfer to Nurse Triage: Back pain lower right side towards the back Reason for Disposition  Back pain is a chronic symptom (recurrent or ongoing AND present > 4 weeks)  Answer Assessment - Initial Assessment Questions 1. ONSET: "When did the pain begin?"      4 weeks ago  2. LOCATION: "Where does it hurt?" (upper, mid or lower back)     Right low back  3. SEVERITY: "How bad is the pain?"  (e.g., Scale 1-10; mild, moderate, or severe)   - MILD (1-3): Doesn't interfere with normal activities.    - MODERATE (4-7): Interferes with normal activities or awakens from sleep.    - SEVERE (8-10): Excruciating pain, unable to do any normal activities.      At rest no pain , dull pain sudden and catches  4. PATTERN: "Is the pain constant?" (e.g., yes, no; constant, intermittent)      Not constant  5. RADIATION: "Does the pain shoot into your legs or somewhere else?"     na 6. CAUSE:  "What do you think is causing the back pain?"      Picked up generator  7. BACK OVERUSE:  "Any recent lifting of heavy objects, strenuous work or exercise?"     Picked up generator  8. MEDICINES: "What have  you taken so far for the pain?" (e.g., nothing, acetaminophen, NSAIDS)     None  9. NEUROLOGIC SYMPTOMS: "Do you have any weakness, numbness, or problems with bowel/bladder control?"     na 10. OTHER SYMPTOMS: "Do you have any other symptoms?" (e.g., fever, abdomen pain, burning with urination, blood in urine)       Low right back after picking up generator  11. PREGNANCY: "Is there any chance you are pregnant?" "When was your last menstrual period?"       na  Protocols used: Back Pain-A-AH

## 2023-10-25 NOTE — Telephone Encounter (Signed)
 FYI about patient care Dr Beverely Low is PCP

## 2023-10-30 ENCOUNTER — Ambulatory Visit: Admitting: Student in an Organized Health Care Education/Training Program

## 2023-11-20 ENCOUNTER — Telehealth: Payer: Self-pay | Admitting: Pulmonary Disease

## 2023-11-20 DIAGNOSIS — J4540 Moderate persistent asthma, uncomplicated: Secondary | ICD-10-CM

## 2023-11-20 NOTE — Telephone Encounter (Signed)
 Refill sent for [name]  to [facility]: [phone]   Dose: 30mg  subcut every 8 weeks  Last OV: 06/27/2023 Provider: Dr. Diania Fortes  Next OV: due in June 2025  Routing to scheduling team for follow-up on appt scheduling  Geraldene Kleine, PharmD, MPH, BCPS Clinical Pharmacist (Rheumatology and Pulmonology)

## 2024-01-17 ENCOUNTER — Other Ambulatory Visit: Payer: Self-pay | Admitting: Pulmonary Disease

## 2024-01-17 DIAGNOSIS — J4540 Moderate persistent asthma, uncomplicated: Secondary | ICD-10-CM

## 2024-01-17 NOTE — Telephone Encounter (Signed)
 Refill sent for [name]  to [facility]: [phone]   Dose: 30mg  subcut every 8 weeks  Last OV: 06/27/2023 Provider: Dr. Kara  Next OV: 02/14/2024  Sherry Pennant, PharmD, MPH, BCPS Clinical Pharmacist (Rheumatology and Pulmonology)

## 2024-02-14 ENCOUNTER — Ambulatory Visit: Admitting: Pulmonary Disease

## 2024-02-14 ENCOUNTER — Encounter: Payer: Self-pay | Admitting: Pulmonary Disease

## 2024-02-14 ENCOUNTER — Telehealth: Payer: Self-pay | Admitting: Pulmonary Disease

## 2024-02-14 VITALS — BP 148/82 | HR 65 | Ht 70.0 in | Wt 207.0 lb

## 2024-02-14 DIAGNOSIS — J4540 Moderate persistent asthma, uncomplicated: Secondary | ICD-10-CM

## 2024-02-14 DIAGNOSIS — G4733 Obstructive sleep apnea (adult) (pediatric): Secondary | ICD-10-CM

## 2024-02-14 NOTE — Progress Notes (Signed)
 Synopsis: Referred in December 2022 for Moderate Persistent Asthma on [name] , previous Dr. Alaine patient  Subjective:   PATIENT ID: Mike Hayes GENDER: male DOB: 1966-03-27, MRN: [id number]  HPI  Chief Complaint  Patient presents with   Medical Management of Chronic Issues    Pt states    Mike Hayes is a 58 year old male, never smoker with moderate persistent asthma and allergic rhinitis who returns to pulmonary clinic for follow up.   Asthma symptoms are well-controlled with [name] , though symptoms returned when [name]  was previously discontinued. Albuterol  use is infrequent.  He experiences difficulty using the CPAP machine due to claustrophobia and discomfort, including feelings of suffocation and drooling. He has explored alternative treatments with the [facility], such as the [place] device, but it is not available. He is considering other management options for sleep apnea.  OV 06/27/23 He presents with fluctuating blood pressure despite being on carvedilol and losartan . He reports that his blood pressure sometimes reaches 160. He also mentions that he has been unable to tolerate his CPAP machine, which was provided by the [facility], due to discomfort and a feeling of suffocation. He has tried various masks but none have been successful. He also mentions that he works rotating shifts, which may be contributing to his sleep issues.  In terms of his asthma, he reports that he has not had an episode since the last time he used his CPAP machine. He has been managing his asthma with [name]  injections and an inhaler, which he has not needed to use recently.  OV 11/15/22 Last seen by by Landry Ferrari 09/05/22 for OSA and ordered for CPAP. He was treated with prednisone  taper at the end of March for increased cough and wheezing. An additional taper was sent in on 4/8 for on going cough. He is feeling much better. He is concerned the CPAP led to his bronchitis. He returned his machine due to insurance  issues with coverage [facility].   OV 07/05/21 He has been doing well since last follow up visit in 09/2020. He has been on fasenra  since 2019 which has been a Secretary/administrator for him. He rarely needs albuterol  as needed. He continues to have issues with seasonal allergies in regards to rhinitis and congestion. He is using as needed sudafed. He is not using any nasal sprays.   He is a never smoker. He is a Midwife, retired. 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  Past Medical History:  Diagnosis Date   Allergy     Anxiety    Cancer (HCC) Unknown   Left ear   Childhood asthma    COPD (chronic obstructive pulmonary disease) (HCC)    Diabetes mellitus    GERD (gastroesophageal reflux disease)    High blood pressure    Seasonal allergies    Sinus trouble    Sleep apnea      Family History  Problem Relation Age of Onset   Allergies Mother    Asthma Mother    Diabetes Mellitus II Mother    COPD Mother    Diabetes Mother    Hearing loss Mother    Hypertension Mother    Cancer Paternal Grandmother    Cancer Maternal Grandmother        was a heavy smoker   Cancer Maternal Grandfather        was a heavy smoker   Cancer Father  Social History   Socioeconomic History   Marital status: Married    Spouse name: Not on file   Number of children: 3   Years of education: Not on file   Highest education level: Not on file  Occupational History   Occupation: deputy sherriff    Comment: Software engineer  Tobacco Use   Smoking status: Never    Passive exposure: Never   Smokeless tobacco: Never  Substance and Sexual Activity   Alcohol use: Yes    Alcohol/week: 0.0 standard drinks of alcohol    Comment: socially   Drug use: No   Sexual activity: Not on file  Other Topics Concern   Not on file  Social History Narrative   Not on file   Social Drivers of Health   Financial Resource Strain: Not on file  Food  Insecurity: Not on file  Transportation Needs: Not on file  Physical Activity: Not on file  Stress: Not on file  Social Connections: Not on file  Intimate Partner Violence: Not on file     Allergies  Allergen Reactions   Exenatide Other (See Comments)    Got knots and rash.     Outpatient Medications Prior to Visit  Medication Sig Dispense Refill   albuterol  (VENTOLIN  HFA) 108 (90 Base) MCG/ACT inhaler INHALE 2 PUFFS BY MOUTH EVERY 4 TO 6 HOURS AS NEEDED 9 g 0   carvedilol (COREG) 12.5 MG tablet Take 25 mg by mouth 2 (two) times daily. 2.5 tablets twice daily     [name]  PEN 30 MG/ML prefilled autoinjector INJECT 1 PEN SUBCUTANEOUSLY  EVERY 8 WEEKS 1 mL 1   furosemide (LASIX) 20 MG tablet Take 20 mg by mouth daily as needed.     glimepiride (AMARYL) 1 MG tablet Take 1 tablet by mouth 2 (two) times daily.      losartan  (COZAAR ) 100 MG tablet Take 100 mg by mouth daily.     metFORMIN (GLUCOPHAGE) 1000 MG tablet Take 1,000 mg by mouth 2 (two) times daily.     Multiple Vitamin (MULTIVITAMIN WITH MINERALS) TABS tablet Take 1 tablet by mouth daily.     Semaglutide,0.25 or 0.5MG /DOS, 2 MG/3ML SOPN INJECT 0.5MG  SUBCUTANEOUSLY ONCE WEEKLY FOR DIABETES *APPROVED*     tadalafil (CIALIS) 10 MG tablet Take 10 mg by mouth daily as needed.     terbinafine (LAMISIL) 250 MG tablet Take 250 mg by mouth daily as needed.     vitamin B-12 (CYANOCOBALAMIN) 500 MCG tablet Take 500 mcg by mouth daily.     No facility-administered medications prior to visit.   Review of Systems  Constitutional:  Negative for chills, fever, malaise/fatigue and weight loss.  HENT:  Negative for congestion, sinus pain and sore throat.   Eyes: Negative.   Respiratory:  Negative for cough, hemoptysis, sputum production, shortness of breath and wheezing.   Cardiovascular:  Negative for chest pain, palpitations, orthopnea, claudication and leg swelling.  Gastrointestinal:  Negative for abdominal pain, heartburn, nausea and  vomiting.  Genitourinary: Negative.   Musculoskeletal:  Negative for joint pain and myalgias.  Skin:  Negative for rash.  Neurological:  Negative for weakness.  Endo/Heme/Allergies: Negative.   Psychiatric/Behavioral: Negative.     Objective:   Vitals:   02/14/24 1119  BP: (!) 148/82  Pulse: 65  SpO2: 98%  Weight: 207 lb (93.9 kg)  Height: 5' 10 (1.778 m)    Physical Exam Constitutional:      General: He is not in acute distress.  Appearance: Normal appearance.  HENT:     Head: Normocephalic and atraumatic.  Eyes:     General: No scleral icterus.    Conjunctiva/sclera: Conjunctivae normal.  Cardiovascular:     Rate and Rhythm: Normal rate and regular rhythm.     Pulses: Normal pulses.     Heart sounds: Normal heart sounds. No murmur heard. Pulmonary:     Effort: Pulmonary effort is normal.     Breath sounds: Normal breath sounds. No wheezing, rhonchi or rales.  Musculoskeletal:     Right lower leg: No edema.     Left lower leg: No edema.  Skin:    General: Skin is warm and dry.  Neurological:     General: No focal deficit present.     Mental Status: He is alert.    CBC    Component Value Date/Time   WBC 4.8 12/21/2022 1418   RBC 4.88 12/21/2022 1418   HGB 14.1 12/21/2022 1418   HCT 41.5 12/21/2022 1418   PLT 130.0 (L) 12/21/2022 1418   MCV 85.1 12/21/2022 1418   MCHC 34.0 12/21/2022 1418   RDW 13.6 12/21/2022 1418   LYMPHSABS 1.8 12/21/2022 1418   MONOABS 0.4 12/21/2022 1418   EOSABS 0.0 12/21/2022 1418   BASOSABS 0.0 12/21/2022 1418   Chest imaging: CXR 10/17/17 Lungs are clear. The heart size and pulmonary vascularity are normal. No adenopathy. There is mild anterior wedging of two lower thoracic vertebral bodies, stable.  PFT:    Latest Ref Rng & Units 09/24/2015    2:30 PM  PFT Results  FVC-Pre L 4.68   FVC-Predicted Pre % 93   FVC-Post L 4.82   FVC-Predicted Post % 96   Pre FEV1/FVC % % 77   Post FEV1/FCV % % 78   FEV1-Pre L 3.59    FEV1-Predicted Pre % 92   FEV1-Post L 3.78   DLCO uncorrected ml/min/mmHg 44.27   DLCO UNC% % 139   DLCO corrected ml/min/mmHg 42.67   DLCO COR %Predicted % 134   DLVA Predicted % 143   TLC L 7.10   TLC % Predicted % 103   RV % Predicted % 71    Labs:  Path:  Echo:  Heart Catheterization:    Assessment & Plan:   Moderate persistent asthma, unspecified whether complicated  Moderate obstructive sleep apnea  Discussion: Mike Hayes is a 58 year old male, never smoker with moderate persistent asthma and allergic rhinitis who returns to pulmonary clinic for follow up.   Asthma -Continue [name]  injections. -Order pulmonary function tests to assess current lung function.  Obstructive Sleep Apnea - does not tolerate CPAP - recommend oral appliance or evaluation for Inspire  Follow-up in 6 months  Mike Chill, MD [facility] Office: [phone]    Current Outpatient Medications:    albuterol  (VENTOLIN  HFA) 108 (90 Base) MCG/ACT inhaler, INHALE 2 PUFFS BY MOUTH EVERY 4 TO 6 HOURS AS NEEDED, Disp: 9 g, Rfl: 0   carvedilol (COREG) 12.5 MG tablet, Take 25 mg by mouth 2 (two) times daily. 2.5 tablets twice daily, Disp: , Rfl:    [name]  PEN 30 MG/ML prefilled autoinjector, INJECT 1 PEN SUBCUTANEOUSLY  EVERY 8 WEEKS, Disp: 1 mL, Rfl: 1   furosemide (LASIX) 20 MG tablet, Take 20 mg by mouth daily as needed., Disp: , Rfl:    glimepiride (AMARYL) 1 MG tablet, Take 1 tablet by mouth 2 (two) times daily. , Disp: , Rfl:    losartan  (COZAAR )  100 MG tablet, Take 100 mg by mouth daily., Disp: , Rfl:    metFORMIN (GLUCOPHAGE) 1000 MG tablet, Take 1,000 mg by mouth 2 (two) times daily., Disp: , Rfl:    Multiple Vitamin (MULTIVITAMIN WITH MINERALS) TABS tablet, Take 1 tablet by mouth daily., Disp: , Rfl:    Semaglutide,0.25 or 0.5MG /DOS, 2 MG/3ML SOPN, INJECT 0.5MG  SUBCUTANEOUSLY ONCE WEEKLY FOR DIABETES *APPROVED*, Disp: , Rfl:    tadalafil (CIALIS) 10 MG  tablet, Take 10 mg by mouth daily as needed., Disp: , Rfl:    terbinafine (LAMISIL) 250 MG tablet, Take 250 mg by mouth daily as needed., Disp: , Rfl:    vitamin B-12 (CYANOCOBALAMIN) 500 MCG tablet, Take 500 mcg by mouth daily., Disp: , Rfl:

## 2024-02-14 NOTE — Patient Instructions (Addendum)
 [facility] with [facility] about using a oral appliance for your sleep apnea vs a community referral to an ENT group for evaluation of Inspire device.   Continue albuterol  as needed  Continue fasenra  injections  Follow up in 6 months, call sooner if needed

## 2024-02-14 NOTE — Telephone Encounter (Signed)
 In error

## 2024-02-14 NOTE — Progress Notes (Signed)
 Synopsis: Referred in December 2022 for Moderate Persistent Asthma on [name] , previous Dr. Alaine patient  Subjective:   PATIENT ID: Mike Hayes GENDER: male DOB: May 12, 1966, MRN: [id number]  HPI  No chief complaint on file.  Mike Hayes is a 58 year old male, never smoker with moderate persistent asthma and allergic rhinitis who returns to pulmonary clinic for follow up.     OV 06/27/23 He presents with fluctuating blood pressure despite being on carvedilol and losartan . He reports that his blood pressure sometimes reaches 160. He also mentions that he has been unable to tolerate his CPAP machine, which was provided by the [facility], due to discomfort and a feeling of suffocation. He has tried various masks but none have been successful. He also mentions that he works rotating shifts, which may be contributing to his sleep issues.  In terms of his asthma, he reports that he has not had an episode since the last time he used his CPAP machine. He has been managing his asthma with [name]  injections and an inhaler, which he has not needed to use recently.  OV 11/15/22 Last seen by by Landry Ferrari 09/05/22 for OSA and ordered for CPAP. He was treated with prednisone  taper at the end of March for increased cough and wheezing. An additional taper was sent in on 4/8 for on going cough. He is feeling much better. He is concerned the CPAP led to his bronchitis. He returned his machine due to insurance issues with coverage and [facility].   OV 07/05/21 He has been doing well since last follow up visit in 09/2020. He has been on fasenra  since 2019 which has been a Secretary/administrator for him. He rarely needs albuterol  as needed. He continues to have issues with seasonal allergies in regards to rhinitis and congestion. He is using as needed sudafed. He is not using any nasal sprays.   He is a never smoker. He is a Midwife, retired. Previously in the  [employer] 1985 to 1989.  Past Medical History:  Diagnosis Date   Childhood asthma    Diabetes mellitus    High blood pressure    Seasonal allergies    Sinus trouble      Family History  Problem Relation Age of Onset   Allergies Mother    Asthma Mother    Diabetes Mellitus II Mother    COPD Mother    Cancer Paternal Grandmother    Cancer Maternal Grandmother        was a heavy smoker   Cancer Maternal Grandfather        was a heavy smoker     Social History   Socioeconomic History   Marital status: Married    Spouse name: Not on file   Number of children: 3   Years of education: Not on file   Highest education level: Not on file  Occupational History   Occupation: Arboriculturist    Comment: Software engineer  Tobacco Use   Smoking status: Never    Passive exposure: Never   Smokeless tobacco: Never  Substance and Sexual Activity   Alcohol use: Yes    Alcohol/week: 0.0 standard drinks of alcohol    Comment: socially   Drug use: No   Sexual activity: Not on file  Other Topics Concern   Not on file  Social History Narrative   Not on file   Social Drivers of Health  Financial Resource Strain: Not on file  Food Insecurity: Not on file  Transportation Needs: Not on file  Physical Activity: Not on file  Stress: Not on file  Social Connections: Not on file  Intimate Partner Violence: Not on file     Allergies  Allergen Reactions   Exenatide Other (See Comments)    Got knots and rash.     Outpatient Medications Prior to Visit  Medication Sig Dispense Refill   albuterol  (VENTOLIN  HFA) 108 (90 Base) MCG/ACT inhaler INHALE 2 PUFFS BY MOUTH EVERY 4 TO 6 HOURS AS NEEDED 9 g 0   carvedilol (COREG) 12.5 MG tablet Take 25 mg by mouth 2 (two) times daily. 2.5 tablets twice daily     [name]  PEN 30 MG/ML prefilled autoinjector INJECT 1 PEN SUBCUTANEOUSLY  EVERY 8 WEEKS 1 mL 1   furosemide (LASIX) 20 MG tablet Take 20 mg by mouth daily as needed.     glimepiride  (AMARYL) 1 MG tablet Take 1 tablet by mouth 2 (two) times daily.      losartan  (COZAAR ) 100 MG tablet Take 100 mg by mouth daily.     metFORMIN (GLUCOPHAGE) 1000 MG tablet Take 1,000 mg by mouth 2 (two) times daily.     Multiple Vitamin (MULTIVITAMIN WITH MINERALS) TABS tablet Take 1 tablet by mouth daily.     Semaglutide,0.25 or 0.5MG /DOS, 2 MG/3ML SOPN INJECT 0.5MG  SUBCUTANEOUSLY ONCE WEEKLY FOR DIABETES *APPROVED*     tadalafil (CIALIS) 10 MG tablet Take 10 mg by mouth daily as needed.     terbinafine (LAMISIL) 250 MG tablet Take 250 mg by mouth daily as needed.     vitamin B-12 (CYANOCOBALAMIN) 500 MCG tablet Take 500 mcg by mouth daily.     No facility-administered medications prior to visit.   Review of Systems  Constitutional:  Negative for chills, fever, malaise/fatigue and weight loss.  HENT:  Negative for congestion, sinus pain and sore throat.   Eyes: Negative.   Respiratory:  Negative for cough, hemoptysis, sputum production, shortness of breath and wheezing.   Cardiovascular:  Negative for chest pain, palpitations, orthopnea, claudication and leg swelling.  Gastrointestinal:  Negative for abdominal pain, heartburn, nausea and vomiting.  Genitourinary: Negative.   Musculoskeletal:  Negative for joint pain and myalgias.  Skin:  Negative for rash.   Objective:   There were no vitals filed for this visit.     Physical Exam Constitutional:      General: He is not in acute distress. HENT:     Head: Normocephalic and atraumatic.  Eyes:     Conjunctiva/sclera: Conjunctivae normal.  Cardiovascular:     Rate and Rhythm: Normal rate and regular rhythm.     Pulses: Normal pulses.     Heart sounds: Normal heart sounds. No murmur heard. Pulmonary:     Effort: Pulmonary effort is normal.     Breath sounds: Normal breath sounds.  Musculoskeletal:     Right lower leg: No edema.     Left lower leg: No edema.  Skin:    General: Skin is warm and dry.  Neurological:      General: No focal deficit present.     Mental Status: He is alert.    CBC    Component Value Date/Time   WBC 4.8 12/21/2022 1418   RBC 4.88 12/21/2022 1418   HGB 14.1 12/21/2022 1418   HCT 41.5 12/21/2022 1418   PLT 130.0 (L) 12/21/2022 1418   MCV 85.1 12/21/2022 1418   MCHC  34.0 12/21/2022 1418   RDW 13.6 12/21/2022 1418   LYMPHSABS 1.8 12/21/2022 1418   MONOABS 0.4 12/21/2022 1418   EOSABS 0.0 12/21/2022 1418   BASOSABS 0.0 12/21/2022 1418   Chest imaging: CXR 10/17/17 Lungs are clear. The heart size and pulmonary vascularity are normal. No adenopathy. There is mild anterior wedging of two lower thoracic vertebral bodies, stable.  PFT:    Latest Ref Rng & Units 09/24/2015    2:30 PM  PFT Results  FVC-Pre L 4.68   FVC-Predicted Pre % 93   FVC-Post L 4.82   FVC-Predicted Post % 96   Pre FEV1/FVC % % 77   Post FEV1/FCV % % 78   FEV1-Pre L 3.59   FEV1-Predicted Pre % 92   FEV1-Post L 3.78   DLCO uncorrected ml/min/mmHg 44.27   DLCO UNC% % 139   DLCO corrected ml/min/mmHg 42.67   DLCO COR %Predicted % 134   DLVA Predicted % 143   TLC L 7.10   TLC % Predicted % 103   RV % Predicted % 71    Labs:  Path:  Echo:  Heart Catheterization:    Assessment & Plan:   Moderate persistent asthma, unspecified whether complicated  Moderate obstructive sleep apnea  Discussion: Mike Hayes is a 58 year old male, never smoker with moderate persistent asthma and allergic rhinitis who returns to pulmonary clinic for follow up.   Asthma Stable on [name]  injections. No recent exacerbations or need for rescue inhaler use. -Continue [name]  injections. -Order pulmonary function tests to assess current lung function.  Obstructive Sleep Apnea Non-compliant with CPAP therapy due to discomfort with mask and pressure settings. -Discussed potential for Inspire device as an alternative treatment. Provided patient with information to consider. -Consider referral to ENT for Inspire  device evaluation if blood pressure control continues to be an issue.  Hypertension Fluctuating blood pressure despite treatment with Carvedilol and Losartan . -Continue Carvedilol twice daily and Losartan  once daily. -Monitor blood pressure closely, consider impact of untreated sleep apnea on blood pressure control.  Follow-up in 6 months with PFTs  Dorn Chill, MD [facility] Office: [phone]   See Amion for personal pager PCCM on call pager [phone] until 7pm. Please call Elink 7p-7a. [phone]     Current Outpatient Medications:    albuterol  (VENTOLIN  HFA) 108 (90 Base) MCG/ACT inhaler, INHALE 2 PUFFS BY MOUTH EVERY 4 TO 6 HOURS AS NEEDED, Disp: 9 g, Rfl: 0   carvedilol (COREG) 12.5 MG tablet, Take 25 mg by mouth 2 (two) times daily. 2.5 tablets twice daily, Disp: , Rfl:    [name]  PEN 30 MG/ML prefilled autoinjector, INJECT 1 PEN SUBCUTANEOUSLY  EVERY 8 WEEKS, Disp: 1 mL, Rfl: 1   furosemide (LASIX) 20 MG tablet, Take 20 mg by mouth daily as needed., Disp: , Rfl:    glimepiride (AMARYL) 1 MG tablet, Take 1 tablet by mouth 2 (two) times daily. , Disp: , Rfl:    losartan  (COZAAR ) 100 MG tablet, Take 100 mg by mouth daily., Disp: , Rfl:    metFORMIN (GLUCOPHAGE) 1000 MG tablet, Take 1,000 mg by mouth 2 (two) times daily., Disp: , Rfl:    Multiple Vitamin (MULTIVITAMIN WITH MINERALS) TABS tablet, Take 1 tablet by mouth daily., Disp: , Rfl:    Semaglutide,0.25 or 0.5MG /DOS, 2 MG/3ML SOPN, INJECT 0.5MG  SUBCUTANEOUSLY ONCE WEEKLY FOR DIABETES *APPROVED*, Disp: , Rfl:    tadalafil (CIALIS) 10 MG tablet, Take 10 mg by mouth daily as needed.,  Disp: , Rfl:    terbinafine (LAMISIL) 250 MG tablet, Take 250 mg by mouth daily as needed., Disp: , Rfl:    vitamin B-12 (CYANOCOBALAMIN) 500 MCG tablet, Take 500 mcg by mouth daily., Disp: , Rfl:

## 2024-02-28 ENCOUNTER — Encounter: Payer: Self-pay | Admitting: Pulmonary Disease

## 2024-02-28 DIAGNOSIS — G4733 Obstructive sleep apnea (adult) (pediatric): Secondary | ICD-10-CM

## 2024-03-05 ENCOUNTER — Ambulatory Visit: Admitting: Family Medicine

## 2024-03-08 ENCOUNTER — Encounter (INDEPENDENT_AMBULATORY_CARE_PROVIDER_SITE_OTHER): Payer: Self-pay

## 2024-03-12 NOTE — Telephone Encounter (Signed)
 Letter dated 03/08/24 has been sent to the patient from ENT

## 2024-04-03 ENCOUNTER — Telehealth: Payer: Self-pay

## 2024-04-03 NOTE — Telephone Encounter (Signed)
 PA renewal initiated automatically by CoverMyMeds.  Submitted a Prior Authorization request to [employer] for [name]  via CoverMyMeds. Will update once we receive a response.   [id number]

## 2024-04-04 ENCOUNTER — Other Ambulatory Visit (HOSPITAL_COMMUNITY): Payer: Self-pay

## 2024-04-04 NOTE — Telephone Encounter (Signed)
 Received notification from [employer] regarding a prior authorization for [name] . Authorization has been APPROVED from 04/03/24 to 05/04/25. Approval letter sent to scan center.  Unable to run test claim because pt must fill through [facility].  Authorization # [id number] Phone # [phone]

## 2024-04-26 ENCOUNTER — Institutional Professional Consult (permissible substitution) (INDEPENDENT_AMBULATORY_CARE_PROVIDER_SITE_OTHER): Admitting: Otolaryngology

## 2024-06-12 ENCOUNTER — Other Ambulatory Visit: Payer: Self-pay | Admitting: Pulmonary Disease

## 2024-06-12 DIAGNOSIS — J4540 Moderate persistent asthma, uncomplicated: Secondary | ICD-10-CM

## 2024-06-12 NOTE — Telephone Encounter (Signed)
 Pt requesting refill of specialty medication - routing to Rx team to advise.

## 2024-06-12 NOTE — Telephone Encounter (Signed)
 Refill sent for [name]  to [facility]: [phone]   Dose: 30mg  Coryell every 8 weeks  Last OV: 02/14/24 Provider: Dr. Kara  Next OV: due January   Aleck Puls, PharmD, BCPS Clinical Pharmacist  [facility]

## 2024-07-22 ENCOUNTER — Institutional Professional Consult (permissible substitution) (INDEPENDENT_AMBULATORY_CARE_PROVIDER_SITE_OTHER)

## 2024-08-01 ENCOUNTER — Institutional Professional Consult (permissible substitution) (INDEPENDENT_AMBULATORY_CARE_PROVIDER_SITE_OTHER): Admitting: Otolaryngology

## 2024-10-14 ENCOUNTER — Ambulatory Visit: Admitting: Pulmonary Disease
